# Patient Record
Sex: Male | Born: 1960 | Race: Black or African American | Hispanic: No | Marital: Single
Health system: Southern US, Community
[De-identification: ages and names within clinical notes are randomized; demographics above are authoritative.]

---

## 2004-06-26 ENCOUNTER — Inpatient Hospital Stay: Payer: Self-pay | Admitting: Internal Medicine

## 2004-06-26 ENCOUNTER — Other Ambulatory Visit: Payer: Self-pay

## 2004-08-27 ENCOUNTER — Emergency Department: Payer: Self-pay | Admitting: Emergency Medicine

## 2004-11-05 ENCOUNTER — Other Ambulatory Visit: Payer: Self-pay

## 2004-11-06 ENCOUNTER — Inpatient Hospital Stay: Payer: Self-pay | Admitting: Internal Medicine

## 2005-10-27 ENCOUNTER — Emergency Department: Payer: Self-pay | Admitting: Emergency Medicine

## 2005-10-27 ENCOUNTER — Other Ambulatory Visit: Payer: Self-pay

## 2006-02-27 ENCOUNTER — Inpatient Hospital Stay: Payer: Self-pay | Admitting: Internal Medicine

## 2006-09-16 ENCOUNTER — Ambulatory Visit: Payer: Self-pay | Admitting: Family

## 2006-12-09 ENCOUNTER — Ambulatory Visit: Payer: Self-pay | Admitting: Family

## 2007-03-03 ENCOUNTER — Ambulatory Visit: Payer: Self-pay | Admitting: Family

## 2007-06-02 ENCOUNTER — Ambulatory Visit: Payer: Self-pay | Admitting: Family

## 2007-06-12 ENCOUNTER — Emergency Department: Payer: Self-pay | Admitting: Emergency Medicine

## 2007-06-17 ENCOUNTER — Emergency Department: Payer: Self-pay | Admitting: Emergency Medicine

## 2007-09-01 ENCOUNTER — Other Ambulatory Visit: Payer: Self-pay

## 2007-09-01 ENCOUNTER — Inpatient Hospital Stay: Payer: Self-pay | Admitting: Internal Medicine

## 2007-10-13 ENCOUNTER — Ambulatory Visit: Payer: Self-pay | Admitting: Family

## 2007-12-22 ENCOUNTER — Ambulatory Visit: Payer: Self-pay | Admitting: Family

## 2008-01-20 ENCOUNTER — Inpatient Hospital Stay: Payer: Self-pay | Admitting: Internal Medicine

## 2008-04-12 ENCOUNTER — Ambulatory Visit: Payer: Self-pay | Admitting: Family

## 2008-04-30 ENCOUNTER — Inpatient Hospital Stay: Payer: Self-pay | Admitting: Internal Medicine

## 2008-05-13 ENCOUNTER — Inpatient Hospital Stay: Payer: Self-pay | Admitting: Internal Medicine

## 2008-07-19 ENCOUNTER — Ambulatory Visit: Payer: Self-pay | Admitting: Family

## 2008-09-15 ENCOUNTER — Emergency Department: Payer: Self-pay | Admitting: Emergency Medicine

## 2008-11-08 ENCOUNTER — Ambulatory Visit: Payer: Self-pay | Admitting: Family

## 2009-02-14 ENCOUNTER — Ambulatory Visit: Payer: Self-pay | Admitting: Family

## 2009-05-09 ENCOUNTER — Ambulatory Visit: Payer: Self-pay | Admitting: Family

## 2009-07-26 ENCOUNTER — Ambulatory Visit: Payer: Self-pay | Admitting: Family Medicine

## 2009-08-08 ENCOUNTER — Ambulatory Visit: Payer: Self-pay | Admitting: Family

## 2009-10-24 ENCOUNTER — Ambulatory Visit: Payer: Self-pay | Admitting: Family

## 2010-01-23 ENCOUNTER — Ambulatory Visit: Payer: Self-pay | Admitting: Family

## 2010-05-01 ENCOUNTER — Ambulatory Visit: Payer: Self-pay | Admitting: Family

## 2010-10-02 ENCOUNTER — Inpatient Hospital Stay: Payer: Self-pay | Admitting: Internal Medicine

## 2010-10-24 ENCOUNTER — Inpatient Hospital Stay: Payer: Self-pay | Admitting: Internal Medicine

## 2010-12-22 ENCOUNTER — Inpatient Hospital Stay: Payer: Self-pay | Admitting: Internal Medicine

## 2011-04-07 ENCOUNTER — Inpatient Hospital Stay: Payer: Self-pay | Admitting: Internal Medicine

## 2011-04-07 LAB — CK TOTAL AND CKMB (NOT AT ARMC)
CK, Total: 128 U/L (ref 35–232)
CK-MB: 7 ng/mL — ABNORMAL HIGH (ref 0.5–3.6)
CK-MB: 4.3 ng/mL — ABNORMAL HIGH (ref 0.5–3.6)

## 2011-04-07 LAB — CBC
MCH: 26.1 pg (ref 26.0–34.0)
MCHC: 32 g/dL (ref 32.0–36.0)
Platelet: 220 10*3/uL (ref 150–440)
RBC: 5.33 10*6/uL (ref 4.40–5.90)

## 2011-04-07 LAB — COMPREHENSIVE METABOLIC PANEL
Albumin: 3.4 g/dL (ref 3.4–5.0)
BUN: 22 mg/dL — ABNORMAL HIGH (ref 7–18)
Bilirubin,Total: 0.9 mg/dL (ref 0.2–1.0)
Chloride: 108 mmol/L — ABNORMAL HIGH (ref 98–107)
Co2: 25 mmol/L (ref 21–32)
Creatinine: 1.94 mg/dL — ABNORMAL HIGH (ref 0.60–1.30)
Glucose: 85 mg/dL (ref 65–99)
Potassium: 4.3 mmol/L (ref 3.5–5.1)
SGOT(AST): 32 U/L (ref 15–37)
SGPT (ALT): 28 U/L
Total Protein: 7.7 g/dL (ref 6.4–8.2)

## 2011-04-07 LAB — TROPONIN I
Troponin-I: 0.06 ng/mL — ABNORMAL HIGH
Troponin-I: 0.06 ng/mL — ABNORMAL HIGH

## 2011-04-07 LAB — PRO B NATRIURETIC PEPTIDE: B-Type Natriuretic Peptide: 18639 pg/mL — ABNORMAL HIGH (ref 0–125)

## 2011-04-08 LAB — PROTIME-INR
INR: 1.4
Prothrombin Time: 17.2 secs — ABNORMAL HIGH (ref 11.5–14.7)

## 2011-04-08 LAB — COMPREHENSIVE METABOLIC PANEL
Albumin: 2.6 g/dL — ABNORMAL LOW (ref 3.4–5.0)
Alkaline Phosphatase: 51 U/L (ref 50–136)
Anion Gap: 12 (ref 7–16)
Calcium, Total: 8.5 mg/dL (ref 8.5–10.1)
Chloride: 105 mmol/L (ref 98–107)
Creatinine: 1.76 mg/dL — ABNORMAL HIGH (ref 0.60–1.30)
EGFR (African American): 53 — ABNORMAL LOW
EGFR (Non-African Amer.): 43 — ABNORMAL LOW
Glucose: 90 mg/dL (ref 65–99)
Osmolality: 287 (ref 275–301)
Potassium: 3.8 mmol/L (ref 3.5–5.1)
SGOT(AST): 20 U/L (ref 15–37)
SGPT (ALT): 20 U/L
Sodium: 143 mmol/L (ref 136–145)
Total Protein: 6.3 g/dL — ABNORMAL LOW (ref 6.4–8.2)

## 2011-04-08 LAB — CBC WITH DIFFERENTIAL/PLATELET
Basophil #: 0 10*3/uL (ref 0.0–0.1)
Basophil %: 0.8 %
HCT: 38.7 % — ABNORMAL LOW (ref 40.0–52.0)
HGB: 12.4 g/dL — ABNORMAL LOW (ref 13.0–18.0)
Lymphocyte %: 16.9 %
MCV: 82 fL (ref 80–100)
Monocyte %: 12.6 %
Neutrophil #: 4.1 10*3/uL (ref 1.4–6.5)
Neutrophil %: 68 %
RBC: 4.69 10*6/uL (ref 4.40–5.90)
RDW: 17.2 % — ABNORMAL HIGH (ref 11.5–14.5)
WBC: 6.1 10*3/uL (ref 3.8–10.6)

## 2011-04-08 LAB — TROPONIN I: Troponin-I: 0.06 ng/mL — ABNORMAL HIGH

## 2011-04-09 LAB — BASIC METABOLIC PANEL
BUN: 27 mg/dL — ABNORMAL HIGH (ref 7–18)
Chloride: 101 mmol/L (ref 98–107)
Creatinine: 1.84 mg/dL — ABNORMAL HIGH (ref 0.60–1.30)
EGFR (African American): 50 — ABNORMAL LOW
EGFR (Non-African Amer.): 41 — ABNORMAL LOW
Glucose: 88 mg/dL (ref 65–99)
Osmolality: 288 (ref 275–301)
Potassium: 3.3 mmol/L — ABNORMAL LOW (ref 3.5–5.1)
Sodium: 142 mmol/L (ref 136–145)

## 2011-09-10 ENCOUNTER — Inpatient Hospital Stay: Payer: Self-pay | Admitting: Internal Medicine

## 2011-09-10 LAB — BASIC METABOLIC PANEL
Chloride: 108 mmol/L — ABNORMAL HIGH (ref 98–107)
Co2: 26 mmol/L (ref 21–32)
Creatinine: 1.86 mg/dL — ABNORMAL HIGH (ref 0.60–1.30)
EGFR (African American): 47 — ABNORMAL LOW
EGFR (Non-African Amer.): 41 — ABNORMAL LOW
Osmolality: 286 (ref 275–301)
Potassium: 3.6 mmol/L (ref 3.5–5.1)
Sodium: 141 mmol/L (ref 136–145)

## 2011-09-10 LAB — CBC WITH DIFFERENTIAL/PLATELET
Basophil %: 0.8 %
Eosinophil #: 0.2 10*3/uL (ref 0.0–0.7)
Eosinophil %: 2.6 %
HGB: 12.4 g/dL — ABNORMAL LOW (ref 13.0–18.0)
Lymphocyte #: 1.2 10*3/uL (ref 1.0–3.6)
Lymphocyte %: 19.8 %
MCV: 78 fL — ABNORMAL LOW (ref 80–100)
Monocyte %: 11.1 %
Neutrophil #: 3.9 10*3/uL (ref 1.4–6.5)
Neutrophil %: 65.7 %
RBC: 4.82 10*6/uL (ref 4.40–5.90)
WBC: 5.9 10*3/uL (ref 3.8–10.6)

## 2011-09-10 LAB — URINALYSIS, COMPLETE
Bacteria: NONE SEEN
Bilirubin,UR: NEGATIVE
Ketone: NEGATIVE
Leukocyte Esterase: NEGATIVE
Ph: 5 (ref 4.5–8.0)
Protein: 100
RBC,UR: 2 /HPF (ref 0–5)

## 2011-09-10 LAB — CK TOTAL AND CKMB (NOT AT ARMC)
CK, Total: 123 U/L (ref 35–232)
CK-MB: 3.1 ng/mL (ref 0.5–3.6)

## 2011-09-10 LAB — APTT: Activated PTT: 32.2 secs (ref 23.6–35.9)

## 2011-09-10 LAB — PRO B NATRIURETIC PEPTIDE: B-Type Natriuretic Peptide: 15986 pg/mL — ABNORMAL HIGH (ref 0–125)

## 2011-09-11 LAB — CK TOTAL AND CKMB (NOT AT ARMC)
CK, Total: 88 U/L (ref 35–232)
CK-MB: 1.8 ng/mL (ref 0.5–3.6)

## 2011-09-11 LAB — CBC WITH DIFFERENTIAL/PLATELET
Basophil #: 0.1 10*3/uL (ref 0.0–0.1)
Eosinophil #: 0.1 10*3/uL (ref 0.0–0.7)
HCT: 38.7 % — ABNORMAL LOW (ref 40.0–52.0)
HGB: 12.8 g/dL — ABNORMAL LOW (ref 13.0–18.0)
Lymphocyte #: 1.2 10*3/uL (ref 1.0–3.6)
Lymphocyte %: 20.5 %
MCH: 25.8 pg — ABNORMAL LOW (ref 26.0–34.0)
MCHC: 33 g/dL (ref 32.0–36.0)
Neutrophil %: 61.7 %
RBC: 4.94 10*6/uL (ref 4.40–5.90)
RDW: 18.6 % — ABNORMAL HIGH (ref 11.5–14.5)
WBC: 5.9 10*3/uL (ref 3.8–10.6)

## 2011-09-11 LAB — BASIC METABOLIC PANEL
BUN: 24 mg/dL — ABNORMAL HIGH (ref 7–18)
Calcium, Total: 8.4 mg/dL — ABNORMAL LOW (ref 8.5–10.1)
Chloride: 108 mmol/L — ABNORMAL HIGH (ref 98–107)
EGFR (Non-African Amer.): 37 — ABNORMAL LOW
Osmolality: 291 (ref 275–301)
Potassium: 3.6 mmol/L (ref 3.5–5.1)

## 2011-09-11 LAB — TROPONIN I
Troponin-I: 0.03 ng/mL
Troponin-I: 0.03 ng/mL

## 2011-09-11 LAB — RAPID HIV-1/2 QL/CONFIRM: HIV-1/2,Rapid Ql: NEGATIVE

## 2011-09-11 LAB — PHOSPHORUS: Phosphorus: 3.2 mg/dL (ref 2.5–4.9)

## 2011-09-12 ENCOUNTER — Emergency Department: Payer: Self-pay | Admitting: Emergency Medicine

## 2011-09-12 LAB — BASIC METABOLIC PANEL
BUN: 23 mg/dL — ABNORMAL HIGH (ref 7–18)
Calcium, Total: 8.4 mg/dL — ABNORMAL LOW (ref 8.5–10.1)
Co2: 27 mmol/L (ref 21–32)
EGFR (African American): 52 — ABNORMAL LOW
EGFR (Non-African Amer.): 45 — ABNORMAL LOW
Glucose: 82 mg/dL (ref 65–99)
Potassium: 3.8 mmol/L (ref 3.5–5.1)
Sodium: 138 mmol/L (ref 136–145)

## 2011-09-12 LAB — CBC WITH DIFFERENTIAL/PLATELET
Basophil %: 0.6 %
Eosinophil #: 0.1 10*3/uL (ref 0.0–0.7)
Eosinophil %: 1.8 %
HGB: 12.8 g/dL — ABNORMAL LOW (ref 13.0–18.0)
Lymphocyte #: 1.1 10*3/uL (ref 1.0–3.6)
Lymphocyte %: 16.5 %
MCV: 79 fL — ABNORMAL LOW (ref 80–100)
Monocyte %: 12.3 %
Neutrophil #: 4.8 10*3/uL (ref 1.4–6.5)
Neutrophil %: 68.8 %
Platelet: 172 10*3/uL (ref 150–440)
RBC: 4.96 10*6/uL (ref 4.40–5.90)
WBC: 7 10*3/uL (ref 3.8–10.6)

## 2011-10-29 ENCOUNTER — Inpatient Hospital Stay: Payer: Self-pay | Admitting: Internal Medicine

## 2011-10-29 LAB — BASIC METABOLIC PANEL
Anion Gap: 9 (ref 7–16)
Chloride: 107 mmol/L (ref 98–107)
Co2: 26 mmol/L (ref 21–32)
Creatinine: 2.07 mg/dL — ABNORMAL HIGH (ref 0.60–1.30)
EGFR (Non-African Amer.): 36 — ABNORMAL LOW
Potassium: 4.8 mmol/L (ref 3.5–5.1)
Sodium: 142 mmol/L (ref 136–145)

## 2011-10-29 LAB — PRO B NATRIURETIC PEPTIDE: B-Type Natriuretic Peptide: 25254 pg/mL — ABNORMAL HIGH (ref 0–125)

## 2011-10-29 LAB — TROPONIN I
Troponin-I: 0.04 ng/mL
Troponin-I: 0.05 ng/mL

## 2011-10-29 LAB — CBC
MCH: 25.2 pg — ABNORMAL LOW (ref 26.0–34.0)
MCV: 77 fL — ABNORMAL LOW (ref 80–100)

## 2011-10-30 LAB — CBC WITH DIFFERENTIAL/PLATELET
Basophil %: 0.7 %
Eosinophil #: 0.1 10*3/uL (ref 0.0–0.7)
HCT: 36 % — ABNORMAL LOW (ref 40.0–52.0)
HGB: 11.7 g/dL — ABNORMAL LOW (ref 13.0–18.0)
Lymphocyte %: 17.6 %
MCHC: 32.5 g/dL (ref 32.0–36.0)
Neutrophil %: 67.6 %
Platelet: 168 10*3/uL (ref 150–440)
RBC: 4.66 10*6/uL (ref 4.40–5.90)

## 2011-10-30 LAB — BASIC METABOLIC PANEL
Anion Gap: 10 (ref 7–16)
BUN: 23 mg/dL — ABNORMAL HIGH (ref 7–18)
Calcium, Total: 8.3 mg/dL — ABNORMAL LOW (ref 8.5–10.1)
Chloride: 108 mmol/L — ABNORMAL HIGH (ref 98–107)
EGFR (Non-African Amer.): 34 — ABNORMAL LOW
Glucose: 133 mg/dL — ABNORMAL HIGH (ref 65–99)
Osmolality: 291 (ref 275–301)
Sodium: 143 mmol/L (ref 136–145)

## 2011-10-30 LAB — TROPONIN I: Troponin-I: 0.05 ng/mL

## 2011-10-30 LAB — POTASSIUM: Potassium: 3.9 mmol/L (ref 3.5–5.1)

## 2011-10-30 LAB — LIPID PANEL
Triglycerides: 64 mg/dL (ref 0–200)
VLDL Cholesterol, Calc: 13 mg/dL (ref 5–40)

## 2011-11-25 ENCOUNTER — Inpatient Hospital Stay: Payer: Self-pay | Admitting: Specialist

## 2011-11-25 LAB — CK TOTAL AND CKMB (NOT AT ARMC)
CK, Total: 169 U/L (ref 35–232)
CK, Total: 124 U/L
CK-MB: 3.5 ng/mL

## 2011-11-25 LAB — COMPREHENSIVE METABOLIC PANEL
Albumin: 2.9 g/dL — ABNORMAL LOW (ref 3.4–5.0)
Alkaline Phosphatase: 64 U/L (ref 50–136)
Calcium, Total: 9 mg/dL (ref 8.5–10.1)
Chloride: 107 mmol/L (ref 98–107)
Co2: 28 mmol/L (ref 21–32)
Creatinine: 2.5 mg/dL — ABNORMAL HIGH (ref 0.60–1.30)
EGFR (African American): 33 — ABNORMAL LOW
EGFR (Non-African Amer.): 28 — ABNORMAL LOW
Glucose: 112 mg/dL — ABNORMAL HIGH (ref 65–99)
SGOT(AST): 22 U/L (ref 15–37)
SGPT (ALT): 13 U/L (ref 12–78)

## 2011-11-25 LAB — CBC
HCT: 41.1 %
HGB: 13.6 g/dL
MCH: 25 pg — ABNORMAL LOW
MCHC: 33 g/dL
MCV: 76 fL — ABNORMAL LOW
Platelet: 190 x10 3/mm 3
RBC: 5.43 x10 6/mm 3
RDW: 19.3 % — ABNORMAL HIGH
WBC: 7.7 x10 3/mm 3

## 2011-11-25 LAB — TROPONIN I
Troponin-I: 0.06 ng/mL — ABNORMAL HIGH
Troponin-I: 0.05 ng/mL

## 2011-11-25 LAB — PRO B NATRIURETIC PEPTIDE: B-Type Natriuretic Peptide: 32993 pg/mL — ABNORMAL HIGH

## 2011-11-26 LAB — CBC WITH DIFFERENTIAL/PLATELET
Basophil #: 0 10*3/uL (ref 0.0–0.1)
Eosinophil #: 0 10*3/uL (ref 0.0–0.7)
Lymphocyte #: 0.5 10*3/uL — ABNORMAL LOW (ref 1.0–3.6)
MCH: 24.9 pg — ABNORMAL LOW (ref 26.0–34.0)
MCHC: 32.5 g/dL (ref 32.0–36.0)
MCV: 77 fL — ABNORMAL LOW (ref 80–100)
Monocyte #: 0.1 x10 3/mm — ABNORMAL LOW (ref 0.2–1.0)
Monocyte %: 1.6 %
Neutrophil #: 4.9 10*3/uL (ref 1.4–6.5)
Neutrophil %: 89.6 %
Platelet: 195 10*3/uL (ref 150–440)
RDW: 19.3 % — ABNORMAL HIGH (ref 11.5–14.5)
WBC: 5.5 10*3/uL (ref 3.8–10.6)

## 2011-11-26 LAB — BASIC METABOLIC PANEL
Calcium, Total: 9 mg/dL (ref 8.5–10.1)
Co2: 26 mmol/L (ref 21–32)
Creatinine: 2.37 mg/dL — ABNORMAL HIGH (ref 0.60–1.30)
Glucose: 171 mg/dL — ABNORMAL HIGH (ref 65–99)
Osmolality: 291 (ref 275–301)
Sodium: 142 mmol/L (ref 136–145)

## 2011-11-26 LAB — PROTIME-INR
INR: 1.4
Prothrombin Time: 17.7 secs — ABNORMAL HIGH (ref 11.5–14.7)

## 2011-11-26 LAB — TROPONIN I: Troponin-I: 0.03 ng/mL

## 2011-11-26 LAB — APTT: Activated PTT: 33.2 secs (ref 23.6–35.9)

## 2011-11-26 LAB — LACTATE DEHYDROGENASE: LDH: 183 U/L (ref 85–241)

## 2011-11-26 LAB — CK TOTAL AND CKMB (NOT AT ARMC)
CK, Total: 96 U/L (ref 35–232)
CK-MB: 2.9 ng/mL (ref 0.5–3.6)

## 2011-11-27 LAB — BODY FLUID CELL COUNT WITH DIFFERENTIAL
Eosinophil: 0 %
Other Cells BF: 0 %
Other Mononuclear Cells: 52 %

## 2011-11-28 LAB — MAGNESIUM
Magnesium: 2 mg/dL
Magnesium: 1.9 mg/dL

## 2011-11-28 LAB — RENAL FUNCTION PANEL
Chloride: 102 mmol/L (ref 98–107)
Co2: 30 mmol/L (ref 21–32)
EGFR (African American): 40 — ABNORMAL LOW
Glucose: 133 mg/dL — ABNORMAL HIGH (ref 65–99)
Osmolality: 294 (ref 275–301)
Phosphorus: 2.9 mg/dL (ref 2.5–4.9)
Potassium: 3.9 mmol/L (ref 3.5–5.1)
Sodium: 141 mmol/L (ref 136–145)

## 2011-11-29 LAB — BASIC METABOLIC PANEL
Anion Gap: 6 — ABNORMAL LOW (ref 7–16)
BUN: 41 mg/dL — ABNORMAL HIGH (ref 7–18)
Chloride: 98 mmol/L (ref 98–107)
Co2: 32 mmol/L (ref 21–32)
Creatinine: 1.64 mg/dL — ABNORMAL HIGH (ref 0.60–1.30)
EGFR (African American): 55 — ABNORMAL LOW
EGFR (Non-African Amer.): 47 — ABNORMAL LOW
Glucose: 140 mg/dL — ABNORMAL HIGH (ref 65–99)
Sodium: 136 mmol/L (ref 136–145)

## 2011-11-30 LAB — CULTURE, BLOOD (SINGLE)

## 2011-12-09 ENCOUNTER — Inpatient Hospital Stay: Payer: Self-pay | Admitting: Specialist

## 2011-12-09 LAB — CBC
HCT: 42.7 % (ref 40.0–52.0)
HGB: 14.2 g/dL (ref 13.0–18.0)
MCV: 76 fL — ABNORMAL LOW (ref 80–100)
RBC: 5.61 10*6/uL (ref 4.40–5.90)
RDW: 19.7 % — ABNORMAL HIGH (ref 11.5–14.5)
WBC: 8.5 10*3/uL (ref 3.8–10.6)

## 2011-12-09 LAB — CBC WITH DIFFERENTIAL/PLATELET
Basophil #: 0.1 10*3/uL (ref 0.0–0.1)
Eosinophil #: 0.1 10*3/uL (ref 0.0–0.7)
HCT: 40 % (ref 40.0–52.0)
Lymphocyte #: 1.2 10*3/uL (ref 1.0–3.6)
MCH: 23.9 pg — ABNORMAL LOW (ref 26.0–34.0)
MCHC: 31.6 g/dL — ABNORMAL LOW (ref 32.0–36.0)
MCV: 76 fL — ABNORMAL LOW (ref 80–100)
Monocyte #: 0.8 x10 3/mm (ref 0.2–1.0)
Monocyte %: 7.9 %
Neutrophil #: 7.8 10*3/uL — ABNORMAL HIGH (ref 1.4–6.5)
Neutrophil %: 78.6 %
Platelet: 135 10*3/uL — ABNORMAL LOW (ref 150–440)
RBC: 5.27 10*6/uL (ref 4.40–5.90)
RDW: 20.2 % — ABNORMAL HIGH (ref 11.5–14.5)
WBC: 9.9 10*3/uL (ref 3.8–10.6)

## 2011-12-09 LAB — BASIC METABOLIC PANEL
Anion Gap: 6 — ABNORMAL LOW (ref 7–16)
Chloride: 106 mmol/L (ref 98–107)
Co2: 29 mmol/L (ref 21–32)
Creatinine: 2 mg/dL — ABNORMAL HIGH (ref 0.60–1.30)
Osmolality: 286 (ref 275–301)
Potassium: 3.8 mmol/L (ref 3.5–5.1)

## 2011-12-09 LAB — PRO B NATRIURETIC PEPTIDE: B-Type Natriuretic Peptide: 42844 pg/mL — ABNORMAL HIGH (ref 0–125)

## 2011-12-09 LAB — TROPONIN I: Troponin-I: 0.04 ng/mL

## 2011-12-09 LAB — CK TOTAL AND CKMB (NOT AT ARMC): CK-MB: 3 ng/mL (ref 0.5–3.6)

## 2011-12-09 LAB — APTT: Activated PTT: 32 secs (ref 23.6–35.9)

## 2011-12-09 LAB — MAGNESIUM: Magnesium: 2.2 mg/dL

## 2011-12-10 LAB — BASIC METABOLIC PANEL
Anion Gap: 6 — ABNORMAL LOW (ref 7–16)
BUN: 23 mg/dL — ABNORMAL HIGH (ref 7–18)
Chloride: 107 mmol/L (ref 98–107)
Co2: 29 mmol/L (ref 21–32)
Creatinine: 1.92 mg/dL — ABNORMAL HIGH (ref 0.60–1.30)
Glucose: 161 mg/dL — ABNORMAL HIGH (ref 65–99)
Osmolality: 290 (ref 275–301)
Potassium: 3.7 mmol/L (ref 3.5–5.1)
Sodium: 142 mmol/L (ref 136–145)

## 2011-12-10 LAB — CK TOTAL AND CKMB (NOT AT ARMC): CK, Total: 70 U/L (ref 35–232)

## 2011-12-10 LAB — MAGNESIUM: Magnesium: 1.9 mg/dL

## 2011-12-10 LAB — CBC WITH DIFFERENTIAL/PLATELET
Basophil %: 0.5 %
Eosinophil #: 0.1 10*3/uL (ref 0.0–0.7)
Eosinophil %: 0.7 %
HCT: 37.7 % — ABNORMAL LOW (ref 40.0–52.0)
HGB: 12.3 g/dL — ABNORMAL LOW (ref 13.0–18.0)
Lymphocyte #: 0.9 10*3/uL — ABNORMAL LOW (ref 1.0–3.6)
MCH: 24.7 pg — ABNORMAL LOW (ref 26.0–34.0)
MCHC: 32.5 g/dL (ref 32.0–36.0)
MCV: 76 fL — ABNORMAL LOW (ref 80–100)
Monocyte #: 0.7 x10 3/mm (ref 0.2–1.0)
Neutrophil #: 6.2 10*3/uL (ref 1.4–6.5)
Neutrophil %: 78 %
RBC: 4.96 10*6/uL (ref 4.40–5.90)

## 2011-12-10 LAB — APTT
Activated PTT: 117.3 secs — ABNORMAL HIGH (ref 23.6–35.9)
Activated PTT: 69.5 secs — ABNORMAL HIGH (ref 23.6–35.9)

## 2011-12-10 LAB — HEMOGLOBIN A1C: Hemoglobin A1C: 6.2 % (ref 4.2–6.3)

## 2011-12-10 LAB — TROPONIN I
Troponin-I: 0.04 ng/mL
Troponin-I: 0.04 ng/mL

## 2011-12-11 LAB — BASIC METABOLIC PANEL
Calcium, Total: 8.3 mg/dL — ABNORMAL LOW (ref 8.5–10.1)
Chloride: 106 mmol/L (ref 98–107)
Co2: 30 mmol/L (ref 21–32)
EGFR (Non-African Amer.): 44 — ABNORMAL LOW
Glucose: 91 mg/dL (ref 65–99)
Osmolality: 289 (ref 275–301)
Potassium: 3.3 mmol/L — ABNORMAL LOW (ref 3.5–5.1)

## 2011-12-12 LAB — BASIC METABOLIC PANEL
Calcium, Total: 8.7 mg/dL (ref 8.5–10.1)
EGFR (Non-African Amer.): 40 — ABNORMAL LOW
Glucose: 82 mg/dL (ref 65–99)
Potassium: 3.3 mmol/L — ABNORMAL LOW (ref 3.5–5.1)
Sodium: 141 mmol/L (ref 136–145)

## 2011-12-12 LAB — PLATELET COUNT: Platelet: 135 10*3/uL — ABNORMAL LOW (ref 150–440)

## 2011-12-13 LAB — BASIC METABOLIC PANEL
Calcium, Total: 8.8 mg/dL (ref 8.5–10.1)
Co2: 33 mmol/L — ABNORMAL HIGH (ref 21–32)
Potassium: 3.4 mmol/L — ABNORMAL LOW (ref 3.5–5.1)
Sodium: 138 mmol/L (ref 136–145)

## 2011-12-14 LAB — BASIC METABOLIC PANEL
Anion Gap: 4 — ABNORMAL LOW (ref 7–16)
Calcium, Total: 8.4 mg/dL — ABNORMAL LOW (ref 8.5–10.1)
Co2: 33 mmol/L — ABNORMAL HIGH (ref 21–32)
EGFR (African American): 50 — ABNORMAL LOW
EGFR (Non-African Amer.): 43 — ABNORMAL LOW
Glucose: 92 mg/dL (ref 65–99)
Osmolality: 286 (ref 275–301)

## 2011-12-15 LAB — CULTURE, BLOOD (SINGLE)

## 2011-12-20 ENCOUNTER — Ambulatory Visit: Payer: Self-pay | Admitting: Internal Medicine

## 2012-01-06 ENCOUNTER — Inpatient Hospital Stay: Payer: Self-pay | Admitting: Internal Medicine

## 2012-01-06 LAB — CBC WITH DIFFERENTIAL/PLATELET
Basophil #: 0 10*3/uL (ref 0.0–0.1)
Eosinophil #: 0 10*3/uL (ref 0.0–0.7)
HCT: 38 % — ABNORMAL LOW (ref 40.0–52.0)
HGB: 12.6 g/dL — ABNORMAL LOW (ref 13.0–18.0)
Lymphocyte #: 1.1 10*3/uL (ref 1.0–3.6)
Lymphocyte %: 11.4 %
MCHC: 33.1 g/dL (ref 32.0–36.0)
MCV: 76 fL — ABNORMAL LOW (ref 80–100)
Monocyte %: 9.3 %
Neutrophil #: 7.6 10*3/uL — ABNORMAL HIGH (ref 1.4–6.5)
Platelet: 218 10*3/uL (ref 150–440)
RDW: 21.8 % — ABNORMAL HIGH (ref 11.5–14.5)
WBC: 9.7 10*3/uL (ref 3.8–10.6)

## 2012-01-06 LAB — URINALYSIS, COMPLETE
Bilirubin,UR: NEGATIVE
Blood: NEGATIVE
Glucose,UR: NEGATIVE mg/dL (ref 0–75)
Leukocyte Esterase: NEGATIVE
Nitrite: NEGATIVE
Ph: 7 (ref 4.5–8.0)
RBC,UR: <1 /HPF (ref 0–5)
Squamous Epithelial: NONE SEEN
WBC UR: <1 /HPF (ref 0–5)

## 2012-01-06 LAB — COMPREHENSIVE METABOLIC PANEL
Albumin: 3.1 g/dL — ABNORMAL LOW (ref 3.4–5.0)
Anion Gap: 9 (ref 7–16)
BUN: 23 mg/dL — ABNORMAL HIGH (ref 7–18)
Bilirubin,Total: 0.9 mg/dL (ref 0.2–1.0)
Chloride: 110 mmol/L — ABNORMAL HIGH (ref 98–107)
Co2: 24 mmol/L (ref 21–32)
Creatinine: 1.61 mg/dL — ABNORMAL HIGH (ref 0.60–1.30)
EGFR (African American): 56 — ABNORMAL LOW
EGFR (Non-African Amer.): 48 — ABNORMAL LOW
Osmolality: 289 (ref 275–301)
Potassium: 4.2 mmol/L (ref 3.5–5.1)
SGOT(AST): 13 U/L — ABNORMAL LOW (ref 15–37)
SGPT (ALT): 11 U/L — ABNORMAL LOW (ref 12–78)
Sodium: 143 mmol/L (ref 136–145)

## 2012-01-06 LAB — TROPONIN I
Troponin-I: 0.03 ng/mL
Troponin-I: 0.04 ng/mL

## 2012-01-06 LAB — CK TOTAL AND CKMB (NOT AT ARMC)
CK, Total: 74 U/L (ref 35–232)
CK-MB: 1.9 ng/mL (ref 0.5–3.6)

## 2012-01-06 LAB — PRO B NATRIURETIC PEPTIDE: B-Type Natriuretic Peptide: 30752 pg/mL — ABNORMAL HIGH (ref 0–125)

## 2012-01-07 LAB — BASIC METABOLIC PANEL
Anion Gap: 6 — ABNORMAL LOW (ref 7–16)
BUN: 31 mg/dL — ABNORMAL HIGH (ref 7–18)
Chloride: 107 mmol/L (ref 98–107)
Co2: 28 mmol/L (ref 21–32)
Creatinine: 1.64 mg/dL — ABNORMAL HIGH (ref 0.60–1.30)
EGFR (Non-African Amer.): 47 — ABNORMAL LOW
Potassium: 4.1 mmol/L (ref 3.5–5.1)

## 2012-01-08 LAB — BASIC METABOLIC PANEL
BUN: 38 mg/dL — ABNORMAL HIGH (ref 7–18)
Calcium, Total: 9.1 mg/dL (ref 8.5–10.1)
Co2: 27 mmol/L (ref 21–32)
Creatinine: 1.72 mg/dL — ABNORMAL HIGH (ref 0.60–1.30)
EGFR (African American): 52 — ABNORMAL LOW
Glucose: 207 mg/dL — ABNORMAL HIGH (ref 65–99)
Osmolality: 291 (ref 275–301)
Potassium: 4.3 mmol/L (ref 3.5–5.1)
Sodium: 138 mmol/L (ref 136–145)

## 2012-01-09 LAB — BASIC METABOLIC PANEL
Anion Gap: 7 (ref 7–16)
BUN: 48 mg/dL — ABNORMAL HIGH (ref 7–18)
Chloride: 101 mmol/L (ref 98–107)
Co2: 30 mmol/L (ref 21–32)
Creatinine: 1.59 mg/dL — ABNORMAL HIGH (ref 0.60–1.30)
Osmolality: 291 (ref 275–301)
Potassium: 3.9 mmol/L (ref 3.5–5.1)
Sodium: 138 mmol/L (ref 136–145)

## 2012-01-20 ENCOUNTER — Ambulatory Visit: Payer: Self-pay | Admitting: Internal Medicine

## 2012-08-09 ENCOUNTER — Inpatient Hospital Stay: Payer: Self-pay | Admitting: Internal Medicine

## 2012-08-09 LAB — COMPREHENSIVE METABOLIC PANEL
Albumin: 3.6 g/dL (ref 3.4–5.0)
Alkaline Phosphatase: 97 U/L (ref 50–136)
Anion Gap: 6 — ABNORMAL LOW (ref 7–16)
Co2: 25 mmol/L (ref 21–32)
EGFR (Non-African Amer.): 46 — ABNORMAL LOW
Glucose: 144 mg/dL — ABNORMAL HIGH (ref 65–99)
Osmolality: 286 (ref 275–301)
Potassium: 4.5 mmol/L (ref 3.5–5.1)
Sodium: 139 mmol/L (ref 136–145)

## 2012-08-09 LAB — CBC
MCH: 24.3 pg — ABNORMAL LOW (ref 26.0–34.0)
MCV: 77 fL — ABNORMAL LOW (ref 80–100)
RDW: 19.6 % — ABNORMAL HIGH (ref 11.5–14.5)
WBC: 11.3 10*3/uL — ABNORMAL HIGH (ref 3.8–10.6)

## 2012-08-09 LAB — TROPONIN I: Troponin-I: 0.02 ng/mL

## 2012-08-09 LAB — CK TOTAL AND CKMB (NOT AT ARMC)
CK, Total: 271 U/L — ABNORMAL HIGH (ref 35–232)
CK-MB: 4.1 ng/mL — ABNORMAL HIGH (ref 0.5–3.6)

## 2012-08-09 LAB — PRO B NATRIURETIC PEPTIDE: B-Type Natriuretic Peptide: 6663 pg/mL — ABNORMAL HIGH (ref 0–125)

## 2012-08-10 LAB — CBC WITH DIFFERENTIAL/PLATELET
Basophil #: 0 10*3/uL (ref 0.0–0.1)
Basophil %: 0.3 %
Eosinophil %: 0 %
HCT: 35.9 % — ABNORMAL LOW (ref 40.0–52.0)
Lymphocyte #: 0.3 10*3/uL — ABNORMAL LOW (ref 1.0–3.6)
Lymphocyte %: 5.8 %
MCHC: 32.7 g/dL (ref 32.0–36.0)
MCV: 76 fL — ABNORMAL LOW (ref 80–100)
Monocyte #: 0.1 x10 3/mm — ABNORMAL LOW (ref 0.2–1.0)
Monocyte %: 2.3 %
Neutrophil %: 91.6 %
Platelet: 224 10*3/uL (ref 150–440)
RDW: 18.9 % — ABNORMAL HIGH (ref 11.5–14.5)

## 2012-08-10 LAB — BASIC METABOLIC PANEL
Anion Gap: 6 — ABNORMAL LOW (ref 7–16)
Co2: 25 mmol/L (ref 21–32)
Creatinine: 1.73 mg/dL — ABNORMAL HIGH (ref 0.60–1.30)
EGFR (Non-African Amer.): 44 — ABNORMAL LOW
Osmolality: 287 (ref 275–301)

## 2012-08-10 LAB — LIPID PANEL
Cholesterol: 154 mg/dL (ref 0–200)
HDL Cholesterol: 55 mg/dL (ref 40–60)
Ldl Cholesterol, Calc: 90 mg/dL (ref 0–100)
Triglycerides: 46 mg/dL (ref 0–200)
VLDL Cholesterol, Calc: 9 mg/dL (ref 5–40)

## 2012-08-10 LAB — CK TOTAL AND CKMB (NOT AT ARMC)
CK, Total: 146 U/L (ref 35–232)
CK, Total: 115 U/L (ref 35–232)
CK-MB: 2.8 ng/mL (ref 0.5–3.6)
CK-MB: 3 ng/mL (ref 0.5–3.6)

## 2012-08-11 LAB — BASIC METABOLIC PANEL
Anion Gap: 5 — ABNORMAL LOW (ref 7–16)
BUN: 47 mg/dL — ABNORMAL HIGH (ref 7–18)
Calcium, Total: 9.2 mg/dL (ref 8.5–10.1)
Chloride: 102 mmol/L (ref 98–107)
Creatinine: 1.89 mg/dL — ABNORMAL HIGH (ref 0.60–1.30)
EGFR (African American): 46 — ABNORMAL LOW
Glucose: 156 mg/dL — ABNORMAL HIGH (ref 65–99)

## 2012-08-11 LAB — CBC WITH DIFFERENTIAL/PLATELET
Eosinophil %: 0 %
HGB: 12.2 g/dL — ABNORMAL LOW (ref 13.0–18.0)
MCH: 25 pg — ABNORMAL LOW (ref 26.0–34.0)
Neutrophil #: 10.8 10*3/uL — ABNORMAL HIGH (ref 1.4–6.5)
Platelet: 212 10*3/uL (ref 150–440)
RBC: 4.91 10*6/uL (ref 4.40–5.90)
WBC: 11.6 10*3/uL — ABNORMAL HIGH (ref 3.8–10.6)

## 2012-08-11 LAB — HEMOGLOBIN A1C: Hemoglobin A1C: 5.2 % (ref 4.2–6.3)

## 2012-08-12 LAB — CBC WITH DIFFERENTIAL/PLATELET
Basophil #: 0 10*3/uL (ref 0.0–0.1)
Basophil %: 0 %
Eosinophil #: 0 10*3/uL (ref 0.0–0.7)
Eosinophil %: 0 %
HCT: 34.6 % — ABNORMAL LOW (ref 40.0–52.0)
HGB: 11.5 g/dL — ABNORMAL LOW (ref 13.0–18.0)
Lymphocyte %: 3.3 %
MCH: 25 pg — ABNORMAL LOW (ref 26.0–34.0)
MCHC: 33.1 g/dL (ref 32.0–36.0)
Monocyte #: 0.3 x10 3/mm (ref 0.2–1.0)
Monocyte %: 2.7 %
Neutrophil #: 9.5 10*3/uL — ABNORMAL HIGH (ref 1.4–6.5)
Neutrophil %: 94 %
RBC: 4.58 10*6/uL (ref 4.40–5.90)
RDW: 19 % — ABNORMAL HIGH (ref 11.5–14.5)

## 2012-08-12 LAB — BASIC METABOLIC PANEL
Anion Gap: 7 (ref 7–16)
Calcium, Total: 8.3 mg/dL — ABNORMAL LOW (ref 8.5–10.1)
Chloride: 98 mmol/L (ref 98–107)
Co2: 26 mmol/L (ref 21–32)
Creatinine: 2.64 mg/dL — ABNORMAL HIGH (ref 0.60–1.30)
EGFR (African American): 31 — ABNORMAL LOW
EGFR (Non-African Amer.): 26 — ABNORMAL LOW

## 2012-08-12 LAB — T4, FREE: Free Thyroxine: 0.92 ng/dL (ref 0.76–1.46)

## 2012-08-13 LAB — BASIC METABOLIC PANEL
BUN: 60 mg/dL — ABNORMAL HIGH (ref 7–18)
Calcium, Total: 8.7 mg/dL (ref 8.5–10.1)
Chloride: 104 mmol/L (ref 98–107)
Co2: 29 mmol/L (ref 21–32)
EGFR (Non-African Amer.): 40 — ABNORMAL LOW
Glucose: 161 mg/dL — ABNORMAL HIGH (ref 65–99)
Potassium: 4.4 mmol/L (ref 3.5–5.1)
Sodium: 138 mmol/L (ref 136–145)

## 2012-08-14 LAB — BASIC METABOLIC PANEL
Anion Gap: 3 — ABNORMAL LOW (ref 7–16)
BUN: 53 mg/dL — ABNORMAL HIGH (ref 7–18)
Calcium, Total: 8.5 mg/dL (ref 8.5–10.1)
Co2: 29 mmol/L (ref 21–32)
Glucose: 206 mg/dL — ABNORMAL HIGH (ref 65–99)
Sodium: 137 mmol/L (ref 136–145)

## 2012-08-15 LAB — BASIC METABOLIC PANEL
Anion Gap: 5 — ABNORMAL LOW (ref 7–16)
BUN: 51 mg/dL — ABNORMAL HIGH (ref 7–18)
Chloride: 104 mmol/L (ref 98–107)
Co2: 27 mmol/L (ref 21–32)
EGFR (African American): >60
Glucose: 125 mg/dL — ABNORMAL HIGH (ref 65–99)
Sodium: 136 mmol/L (ref 136–145)

## 2012-08-16 LAB — CULTURE, BLOOD (SINGLE)

## 2012-08-16 LAB — CBC WITH DIFFERENTIAL/PLATELET
HCT: 33.8 % — ABNORMAL LOW (ref 40.0–52.0)
HGB: 11.2 g/dL — ABNORMAL LOW (ref 13.0–18.0)
Lymphocytes: 12 %
MCH: 24.9 pg — ABNORMAL LOW (ref 26.0–34.0)
MCHC: 33.2 g/dL (ref 32.0–36.0)
MCV: 75 fL — ABNORMAL LOW (ref 80–100)
Monocytes: 11 %
Platelet: 202 10*3/uL (ref 150–440)
Segmented Neutrophils: 77 %
WBC: 8.5 10*3/uL (ref 3.8–10.6)

## 2012-08-16 LAB — BASIC METABOLIC PANEL
Anion Gap: 4 — ABNORMAL LOW (ref 7–16)
BUN: 48 mg/dL — ABNORMAL HIGH (ref 7–18)
Calcium, Total: 8.5 mg/dL (ref 8.5–10.1)
Chloride: 104 mmol/L (ref 98–107)
EGFR (African American): >60
EGFR (Non-African Amer.): 54 — ABNORMAL LOW
Glucose: 91 mg/dL (ref 65–99)
Osmolality: 284 (ref 275–301)
Potassium: 4.4 mmol/L (ref 3.5–5.1)
Sodium: 136 mmol/L (ref 136–145)

## 2012-08-24 LAB — CULTURE, BLOOD (SINGLE)

## 2012-11-08 ENCOUNTER — Emergency Department: Payer: Self-pay | Admitting: Emergency Medicine

## 2012-11-08 LAB — BASIC METABOLIC PANEL
Anion Gap: 5 — ABNORMAL LOW (ref 7–16)
Chloride: 111 mmol/L — ABNORMAL HIGH (ref 98–107)
Creatinine: 1.71 mg/dL — ABNORMAL HIGH (ref 0.60–1.30)
EGFR (African American): 52 — ABNORMAL LOW
EGFR (Non-African Amer.): 45 — ABNORMAL LOW
Glucose: 94 mg/dL (ref 65–99)
Osmolality: 282 (ref 275–301)

## 2012-11-08 LAB — TROPONIN I
Troponin-I: 0.04 ng/mL
Troponin-I: 0.04 ng/mL

## 2012-11-08 LAB — CBC
HCT: 35.3 % — ABNORMAL LOW (ref 40.0–52.0)
HGB: 11.4 g/dL — ABNORMAL LOW (ref 13.0–18.0)
MCHC: 32.2 g/dL (ref 32.0–36.0)
Platelet: 199 10*3/uL (ref 150–440)
WBC: 7.3 10*3/uL (ref 3.8–10.6)

## 2012-11-08 LAB — PRO B NATRIURETIC PEPTIDE: B-Type Natriuretic Peptide: 9879 pg/mL — ABNORMAL HIGH (ref 0–125)

## 2013-01-01 ENCOUNTER — Inpatient Hospital Stay: Payer: Self-pay | Admitting: Internal Medicine

## 2013-01-01 LAB — CBC WITH DIFFERENTIAL/PLATELET
Basophil #: 0.1 10*3/uL (ref 0.0–0.1)
Basophil %: 0.8 %
Eosinophil #: 0 10*3/uL (ref 0.0–0.7)
Eosinophil %: 0.4 %
HGB: 11.2 g/dL — ABNORMAL LOW (ref 13.0–18.0)
Lymphocyte #: 0.9 10*3/uL — ABNORMAL LOW (ref 1.0–3.6)
Lymphocyte %: 10.7 %
MCH: 23 pg — ABNORMAL LOW (ref 26.0–34.0)
MCHC: 31.4 g/dL — ABNORMAL LOW (ref 32.0–36.0)
MCV: 74 fL — ABNORMAL LOW (ref 80–100)
Monocyte #: 0.4 x10 3/mm (ref 0.2–1.0)
Monocyte %: 5.3 %
RDW: 18.8 % — ABNORMAL HIGH (ref 11.5–14.5)

## 2013-01-01 LAB — URINALYSIS, COMPLETE
Bilirubin,UR: NEGATIVE
Blood: NEGATIVE
Glucose,UR: NEGATIVE mg/dL (ref 0–75)
Ketone: NEGATIVE
Leukocyte Esterase: NEGATIVE
Ph: 6 (ref 4.5–8.0)
Protein: 100
RBC,UR: <1 /HPF (ref 0–5)
Squamous Epithelial: NONE SEEN

## 2013-01-01 LAB — COMPREHENSIVE METABOLIC PANEL
Alkaline Phosphatase: 73 U/L
Bilirubin,Total: 0.5 mg/dL (ref 0.2–1.0)
Co2: 26 mmol/L (ref 21–32)
Creatinine: 1.86 mg/dL — ABNORMAL HIGH (ref 0.60–1.30)
EGFR (African American): 47 — ABNORMAL LOW
Osmolality: 277 (ref 275–301)
Potassium: 3.5 mmol/L (ref 3.5–5.1)
SGOT(AST): 17 U/L (ref 15–37)
SGPT (ALT): 19 U/L (ref 12–78)
Sodium: 137 mmol/L (ref 136–145)
Total Protein: 7.2 g/dL (ref 6.4–8.2)

## 2013-01-01 LAB — TROPONIN I: Troponin-I: 0.03 ng/mL

## 2013-01-01 LAB — PRO B NATRIURETIC PEPTIDE: B-Type Natriuretic Peptide: 14021 pg/mL — ABNORMAL HIGH (ref 0–125)

## 2013-01-02 LAB — COMPREHENSIVE METABOLIC PANEL
Albumin: 2.8 g/dL — ABNORMAL LOW (ref 3.4–5.0)
Alkaline Phosphatase: 71 U/L
BUN: 21 mg/dL — ABNORMAL HIGH (ref 7–18)
Calcium, Total: 8.9 mg/dL (ref 8.5–10.1)
EGFR (African American): 47 — ABNORMAL LOW
EGFR (Non-African Amer.): 40 — ABNORMAL LOW
Glucose: 159 mg/dL — ABNORMAL HIGH (ref 65–99)
Osmolality: 284 (ref 275–301)
Potassium: 3.3 mmol/L — ABNORMAL LOW (ref 3.5–5.1)
SGPT (ALT): 16 U/L (ref 12–78)
Sodium: 139 mmol/L (ref 136–145)
Total Protein: 6.9 g/dL (ref 6.4–8.2)

## 2013-01-02 LAB — TSH: Thyroid Stimulating Horm: 0.696 u[IU]/mL

## 2013-01-03 LAB — BASIC METABOLIC PANEL
Anion Gap: 2 — ABNORMAL LOW (ref 7–16)
BUN: 31 mg/dL — ABNORMAL HIGH (ref 7–18)
Calcium, Total: 8.7 mg/dL (ref 8.5–10.1)
EGFR (African American): 40 — ABNORMAL LOW
EGFR (Non-African Amer.): 34 — ABNORMAL LOW
Potassium: 3.6 mmol/L (ref 3.5–5.1)

## 2013-01-04 DIAGNOSIS — I059 Rheumatic mitral valve disease, unspecified: Secondary | ICD-10-CM

## 2013-01-06 LAB — CULTURE, BLOOD (SINGLE)

## 2013-01-12 ENCOUNTER — Inpatient Hospital Stay: Payer: Self-pay | Admitting: Internal Medicine

## 2013-01-12 LAB — BASIC METABOLIC PANEL
Anion Gap: 6 — ABNORMAL LOW (ref 7–16)
Calcium, Total: 8.8 mg/dL (ref 8.5–10.1)
Co2: 25 mmol/L (ref 21–32)
EGFR (African American): 48 — ABNORMAL LOW
EGFR (Non-African Amer.): 41 — ABNORMAL LOW
Glucose: 81 mg/dL (ref 65–99)
Sodium: 141 mmol/L (ref 136–145)

## 2013-01-12 LAB — CBC
HCT: 37.5 % — ABNORMAL LOW (ref 40.0–52.0)
MCH: 23.6 pg — ABNORMAL LOW (ref 26.0–34.0)
MCV: 76 fL — ABNORMAL LOW (ref 80–100)
Platelet: 221 10*3/uL (ref 150–440)
RDW: 20.3 % — ABNORMAL HIGH (ref 11.5–14.5)

## 2013-01-12 LAB — PRO B NATRIURETIC PEPTIDE: B-Type Natriuretic Peptide: 33456 pg/mL — ABNORMAL HIGH (ref 0–125)

## 2013-01-13 LAB — CBC WITH DIFFERENTIAL/PLATELET
Basophil #: 0.1 10*3/uL (ref 0.0–0.1)
Eosinophil #: 0.1 10*3/uL (ref 0.0–0.7)
Eosinophil %: 1.3 %
HCT: 34.9 % — ABNORMAL LOW (ref 40.0–52.0)
HGB: 11.2 g/dL — ABNORMAL LOW (ref 13.0–18.0)
Lymphocyte #: 0.8 10*3/uL — ABNORMAL LOW (ref 1.0–3.6)
MCH: 23.7 pg — ABNORMAL LOW (ref 26.0–34.0)
MCV: 74 fL — ABNORMAL LOW (ref 80–100)
Monocyte #: 0.8 x10 3/mm (ref 0.2–1.0)
Monocyte %: 10.3 %
Neutrophil #: 6.2 10*3/uL (ref 1.4–6.5)
Neutrophil %: 77.4 %
Platelet: 202 10*3/uL (ref 150–440)
RDW: 19.3 % — ABNORMAL HIGH (ref 11.5–14.5)

## 2013-01-13 LAB — BASIC METABOLIC PANEL
Anion Gap: 3 — ABNORMAL LOW (ref 7–16)
BUN: 27 mg/dL — ABNORMAL HIGH (ref 7–18)
Calcium, Total: 8.8 mg/dL (ref 8.5–10.1)
Chloride: 107 mmol/L (ref 98–107)
Co2: 27 mmol/L (ref 21–32)
Creatinine: 1.85 mg/dL — ABNORMAL HIGH (ref 0.60–1.30)
Creatinine: 1.89 mg/dL — ABNORMAL HIGH (ref 0.60–1.30)
EGFR (African American): 47 — ABNORMAL LOW
EGFR (African American): 46 — ABNORMAL LOW
EGFR (Non-African Amer.): 41 — ABNORMAL LOW
EGFR (Non-African Amer.): 40 — ABNORMAL LOW
Osmolality: 288 (ref 275–301)
Osmolality: 287 (ref 275–301)
Potassium: 3.9 mmol/L (ref 3.5–5.1)
Potassium: 3.6 mmol/L (ref 3.5–5.1)

## 2013-01-13 LAB — TROPONIN I: Troponin-I: 0.04 ng/mL

## 2013-01-14 LAB — BASIC METABOLIC PANEL
Anion Gap: 4 — ABNORMAL LOW (ref 7–16)
BUN: 32 mg/dL — ABNORMAL HIGH (ref 7–18)
Co2: 30 mmol/L (ref 21–32)
EGFR (African American): 45 — ABNORMAL LOW
EGFR (Non-African Amer.): 39 — ABNORMAL LOW
Potassium: 3.5 mmol/L (ref 3.5–5.1)
Sodium: 139 mmol/L (ref 136–145)

## 2013-01-14 LAB — MAGNESIUM: Magnesium: 2.1 mg/dL

## 2013-01-14 LAB — IRON AND TIBC
Iron: 27 ug/dL — ABNORMAL LOW (ref 65–175)
Unbound Iron-Bind.Cap.: 261 ug/dL

## 2013-01-14 LAB — FERRITIN: Ferritin (ARMC): 104 ng/mL (ref 8–388)

## 2013-02-20 ENCOUNTER — Inpatient Hospital Stay: Payer: Self-pay | Admitting: Internal Medicine

## 2013-02-20 LAB — CK TOTAL AND CKMB (NOT AT ARMC)
CK, Total: 177 U/L (ref 35–232)
CK, Total: 137 U/L (ref 35–232)
CK-MB: 5.7 ng/mL — AB (ref 0.5–3.6)
CK-MB: 4.1 ng/mL — ABNORMAL HIGH (ref 0.5–3.6)

## 2013-02-20 LAB — BASIC METABOLIC PANEL
Anion Gap: 2 — ABNORMAL LOW (ref 7–16)
BUN: 47 mg/dL — AB (ref 7–18)
CALCIUM: 9 mg/dL (ref 8.5–10.1)
Chloride: 108 mmol/L — ABNORMAL HIGH (ref 98–107)
Co2: 28 mmol/L (ref 21–32)
Creatinine: 2.18 mg/dL — ABNORMAL HIGH (ref 0.60–1.30)
EGFR (African American): 38 — ABNORMAL LOW
EGFR (Non-African Amer.): 33 — ABNORMAL LOW
GLUCOSE: 86 mg/dL (ref 65–99)
OSMOLALITY: 287 (ref 275–301)
Potassium: 4.1 mmol/L (ref 3.5–5.1)
SODIUM: 138 mmol/L (ref 136–145)

## 2013-02-20 LAB — CBC
HCT: 45.7 % (ref 40.0–52.0)
HGB: 14.2 g/dL (ref 13.0–18.0)
MCH: 24.1 pg — ABNORMAL LOW (ref 26.0–34.0)
MCHC: 31 g/dL — ABNORMAL LOW (ref 32.0–36.0)
MCV: 78 fL — ABNORMAL LOW (ref 80–100)
Platelet: 148 10*3/uL — ABNORMAL LOW (ref 150–440)
RBC: 5.89 10*6/uL (ref 4.40–5.90)
RDW: 22 % — ABNORMAL HIGH (ref 11.5–14.5)
WBC: 9.9 10*3/uL (ref 3.8–10.6)

## 2013-02-20 LAB — TROPONIN I
Troponin-I: 0.02 ng/mL
Troponin-I: 0.03 ng/mL

## 2013-02-20 LAB — PRO B NATRIURETIC PEPTIDE: B-Type Natriuretic Peptide: 26752 pg/mL — ABNORMAL HIGH (ref 0–125)

## 2013-02-21 LAB — BASIC METABOLIC PANEL
ANION GAP: 2 — AB (ref 7–16)
BUN: 42 mg/dL — ABNORMAL HIGH (ref 7–18)
CO2: 29 mmol/L (ref 21–32)
CREATININE: 1.99 mg/dL — AB (ref 0.60–1.30)
Calcium, Total: 8.8 mg/dL (ref 8.5–10.1)
Chloride: 107 mmol/L (ref 98–107)
GFR CALC AF AMER: 43 — AB
GFR CALC NON AF AMER: 37 — AB
Glucose: 132 mg/dL — ABNORMAL HIGH (ref 65–99)
Osmolality: 288 (ref 275–301)
Potassium: 4.4 mmol/L (ref 3.5–5.1)
Sodium: 138 mmol/L (ref 136–145)

## 2013-02-21 LAB — CBC WITH DIFFERENTIAL/PLATELET
BASOS ABS: 0 10*3/uL (ref 0.0–0.1)
Basophil %: 0.2 %
EOS PCT: 0 %
Eosinophil #: 0 10*3/uL (ref 0.0–0.7)
HCT: 43.2 % (ref 40.0–52.0)
HGB: 13.7 g/dL (ref 13.0–18.0)
LYMPHS ABS: 0.3 10*3/uL — AB (ref 1.0–3.6)
LYMPHS PCT: 4.8 %
MCH: 24.2 pg — ABNORMAL LOW (ref 26.0–34.0)
MCHC: 31.7 g/dL — ABNORMAL LOW (ref 32.0–36.0)
MCV: 76 fL — AB (ref 80–100)
Monocyte #: 0.3 x10 3/mm (ref 0.2–1.0)
Monocyte %: 4.8 %
Neutrophil #: 5 10*3/uL (ref 1.4–6.5)
Neutrophil %: 90.2 %
Platelet: 153 10*3/uL (ref 150–440)
RBC: 5.65 10*6/uL (ref 4.40–5.90)
RDW: 21.8 % — ABNORMAL HIGH (ref 11.5–14.5)
WBC: 5.5 10*3/uL (ref 3.8–10.6)

## 2013-02-21 LAB — TROPONIN I: Troponin-I: 0.02 ng/mL

## 2013-02-21 LAB — CK TOTAL AND CKMB (NOT AT ARMC)
CK, Total: 126 U/L
CK-MB: 3.8 ng/mL — AB (ref 0.5–3.6)

## 2013-02-23 LAB — BASIC METABOLIC PANEL
Anion Gap: 7 (ref 7–16)
BUN: 64 mg/dL — ABNORMAL HIGH (ref 7–18)
CALCIUM: 9.2 mg/dL (ref 8.5–10.1)
CHLORIDE: 100 mmol/L (ref 98–107)
CO2: 28 mmol/L (ref 21–32)
Creatinine: 2.41 mg/dL — ABNORMAL HIGH (ref 0.60–1.30)
EGFR (Non-African Amer.): 29 — ABNORMAL LOW
GFR CALC AF AMER: 34 — AB
Glucose: 185 mg/dL — ABNORMAL HIGH (ref 65–99)
Osmolality: 293 (ref 275–301)
POTASSIUM: 3.9 mmol/L (ref 3.5–5.1)
Sodium: 135 mmol/L — ABNORMAL LOW (ref 136–145)

## 2013-04-02 ENCOUNTER — Inpatient Hospital Stay: Payer: Self-pay | Admitting: Specialist

## 2013-04-02 LAB — COMPREHENSIVE METABOLIC PANEL
ALK PHOS: 71 U/L
AST: 19 U/L (ref 15–37)
Albumin: 2.9 g/dL — ABNORMAL LOW (ref 3.4–5.0)
Anion Gap: 6 — ABNORMAL LOW (ref 7–16)
BUN: 15 mg/dL (ref 7–18)
Bilirubin,Total: 0.9 mg/dL (ref 0.2–1.0)
Calcium, Total: 8.6 mg/dL (ref 8.5–10.1)
Chloride: 111 mmol/L — ABNORMAL HIGH (ref 98–107)
Co2: 26 mmol/L (ref 21–32)
Creatinine: 1.79 mg/dL — ABNORMAL HIGH (ref 0.60–1.30)
EGFR (African American): 49 — ABNORMAL LOW
EGFR (Non-African Amer.): 42 — ABNORMAL LOW
Glucose: 128 mg/dL — ABNORMAL HIGH (ref 65–99)
OSMOLALITY: 287 (ref 275–301)
POTASSIUM: 3.7 mmol/L (ref 3.5–5.1)
SGPT (ALT): 18 U/L (ref 12–78)
SODIUM: 143 mmol/L (ref 136–145)
Total Protein: 6.7 g/dL (ref 6.4–8.2)

## 2013-04-02 LAB — URINALYSIS, COMPLETE
BILIRUBIN, UR: NEGATIVE
Bacteria: NONE SEEN
Blood: NEGATIVE
Glucose,UR: NEGATIVE mg/dL (ref 0–75)
Hyaline Cast: 2
KETONE: NEGATIVE
LEUKOCYTE ESTERASE: NEGATIVE
Nitrite: NEGATIVE
Ph: 6 (ref 4.5–8.0)
RBC,UR: <1 /HPF (ref 0–5)
Specific Gravity: 1.017 (ref 1.003–1.030)
Squamous Epithelial: NONE SEEN
WBC UR: <1 /HPF (ref 0–5)

## 2013-04-02 LAB — PRO B NATRIURETIC PEPTIDE: B-Type Natriuretic Peptide: 32288 pg/mL — ABNORMAL HIGH (ref 0–125)

## 2013-04-02 LAB — TROPONIN I: Troponin-I: 0.03 ng/mL

## 2013-04-02 LAB — CBC
HCT: 40.2 % (ref 40.0–52.0)
HGB: 12.9 g/dL — AB (ref 13.0–18.0)
MCH: 25 pg — ABNORMAL LOW (ref 26.0–34.0)
MCHC: 32.2 g/dL (ref 32.0–36.0)
MCV: 78 fL — ABNORMAL LOW (ref 80–100)
Platelet: 188 10*3/uL (ref 150–440)
RBC: 5.18 10*6/uL (ref 4.40–5.90)
RDW: 23.1 % — ABNORMAL HIGH (ref 11.5–14.5)
WBC: 7.1 10*3/uL (ref 3.8–10.6)

## 2013-04-02 LAB — MAGNESIUM: MAGNESIUM: 1.7 mg/dL — AB

## 2013-04-03 LAB — CBC WITH DIFFERENTIAL/PLATELET
BASOS ABS: 0 10*3/uL (ref 0.0–0.1)
Basophil %: 0.6 %
EOS PCT: 0.4 %
Eosinophil #: 0 10*3/uL (ref 0.0–0.7)
HCT: 38.9 % — ABNORMAL LOW (ref 40.0–52.0)
HGB: 12.3 g/dL — ABNORMAL LOW (ref 13.0–18.0)
LYMPHS ABS: 0.8 10*3/uL — AB (ref 1.0–3.6)
Lymphocyte %: 11.6 %
MCH: 24.5 pg — ABNORMAL LOW (ref 26.0–34.0)
MCHC: 31.5 g/dL — ABNORMAL LOW (ref 32.0–36.0)
MCV: 78 fL — ABNORMAL LOW (ref 80–100)
Monocyte #: 0.7 x10 3/mm (ref 0.2–1.0)
Monocyte %: 11 %
NEUTROS ABS: 5.1 10*3/uL (ref 1.4–6.5)
Neutrophil %: 76.4 %
PLATELETS: 184 10*3/uL (ref 150–440)
RBC: 5 10*6/uL (ref 4.40–5.90)
RDW: 22.8 % — ABNORMAL HIGH (ref 11.5–14.5)
WBC: 6.7 10*3/uL (ref 3.8–10.6)

## 2013-04-03 LAB — BASIC METABOLIC PANEL
ANION GAP: 5 — AB (ref 7–16)
BUN: 20 mg/dL — AB (ref 7–18)
CALCIUM: 8 mg/dL — AB (ref 8.5–10.1)
CHLORIDE: 108 mmol/L — AB (ref 98–107)
CO2: 28 mmol/L (ref 21–32)
CREATININE: 2 mg/dL — AB (ref 0.60–1.30)
EGFR (African American): 43 — ABNORMAL LOW
GFR CALC NON AF AMER: 37 — AB
Glucose: 143 mg/dL — ABNORMAL HIGH (ref 65–99)
Osmolality: 286 (ref 275–301)
Potassium: 3.5 mmol/L (ref 3.5–5.1)
SODIUM: 141 mmol/L (ref 136–145)

## 2013-04-03 LAB — MAGNESIUM: Magnesium: 1.8 mg/dL

## 2013-04-04 LAB — BASIC METABOLIC PANEL
ANION GAP: 2 — AB (ref 7–16)
BUN: 20 mg/dL — ABNORMAL HIGH (ref 7–18)
CALCIUM: 8.1 mg/dL — AB (ref 8.5–10.1)
CHLORIDE: 105 mmol/L (ref 98–107)
CO2: 31 mmol/L (ref 21–32)
Creatinine: 1.93 mg/dL — ABNORMAL HIGH (ref 0.60–1.30)
EGFR (African American): 44 — ABNORMAL LOW
EGFR (Non-African Amer.): 38 — ABNORMAL LOW
Glucose: 84 mg/dL (ref 65–99)
Osmolality: 277 (ref 275–301)
POTASSIUM: 3.5 mmol/L (ref 3.5–5.1)
Sodium: 138 mmol/L (ref 136–145)

## 2013-04-07 LAB — CULTURE, BLOOD (SINGLE)

## 2013-04-25 ENCOUNTER — Inpatient Hospital Stay: Payer: Self-pay | Admitting: Internal Medicine

## 2013-04-25 LAB — CBC
HCT: 37.6 % — ABNORMAL LOW (ref 40.0–52.0)
HGB: 11.9 g/dL — ABNORMAL LOW (ref 13.0–18.0)
MCH: 25.2 pg — ABNORMAL LOW (ref 26.0–34.0)
MCHC: 31.7 g/dL — AB (ref 32.0–36.0)
MCV: 79 fL — ABNORMAL LOW (ref 80–100)
Platelet: 122 10*3/uL — ABNORMAL LOW (ref 150–440)
RBC: 4.73 10*6/uL (ref 4.40–5.90)
RDW: 21.4 % — ABNORMAL HIGH (ref 11.5–14.5)
WBC: 8.3 10*3/uL (ref 3.8–10.6)

## 2013-04-25 LAB — URINALYSIS, COMPLETE
Bacteria: NONE SEEN
Bilirubin,UR: NEGATIVE
GLUCOSE, UR: NEGATIVE mg/dL (ref 0–75)
Ketone: NEGATIVE
Leukocyte Esterase: NEGATIVE
Nitrite: NEGATIVE
Ph: 5 (ref 4.5–8.0)
RBC,UR: 1 /HPF (ref 0–5)
Specific Gravity: 1.015 (ref 1.003–1.030)
Squamous Epithelial: NONE SEEN
WBC UR: <1 /HPF (ref 0–5)

## 2013-04-25 LAB — COMPREHENSIVE METABOLIC PANEL
ALT: 17 U/L (ref 12–78)
AST: 21 U/L (ref 15–37)
Albumin: 3 g/dL — ABNORMAL LOW (ref 3.4–5.0)
Alkaline Phosphatase: 64 U/L
Anion Gap: 5 — ABNORMAL LOW (ref 7–16)
BILIRUBIN TOTAL: 1.2 mg/dL — AB (ref 0.2–1.0)
BUN: 22 mg/dL — ABNORMAL HIGH (ref 7–18)
Calcium, Total: 8.7 mg/dL (ref 8.5–10.1)
Chloride: 108 mmol/L — ABNORMAL HIGH (ref 98–107)
Co2: 26 mmol/L (ref 21–32)
Creatinine: 2.13 mg/dL — ABNORMAL HIGH (ref 0.60–1.30)
EGFR (Non-African Amer.): 34 — ABNORMAL LOW
GFR CALC AF AMER: 39 — AB
Glucose: 87 mg/dL (ref 65–99)
OSMOLALITY: 280 (ref 275–301)
Potassium: 3.9 mmol/L (ref 3.5–5.1)
SODIUM: 139 mmol/L (ref 136–145)
TOTAL PROTEIN: 6.8 g/dL (ref 6.4–8.2)

## 2013-04-25 LAB — DRUG SCREEN, URINE
Amphetamines, Ur Screen: NEGATIVE (ref ?–1000)
Barbiturates, Ur Screen: NEGATIVE (ref ?–200)
Benzodiazepine, Ur Scrn: NEGATIVE (ref ?–200)
CANNABINOID 50 NG, UR ~~LOC~~: NEGATIVE (ref ?–50)
COCAINE METABOLITE, UR ~~LOC~~: NEGATIVE (ref ?–300)
MDMA (Ecstasy)Ur Screen: NEGATIVE (ref ?–500)
Methadone, Ur Screen: NEGATIVE (ref ?–300)
Opiate, Ur Screen: POSITIVE (ref ?–300)
PHENCYCLIDINE (PCP) UR S: NEGATIVE (ref ?–25)
TRICYCLIC, UR SCREEN: NEGATIVE (ref ?–1000)

## 2013-04-25 LAB — RAPID HIV-1/2 QL/CONFIRM: HIV-1/2,Rapid Ql: NEGATIVE

## 2013-04-25 LAB — TROPONIN I
TROPONIN-I: 0.1 ng/mL — AB
TROPONIN-I: 0.1 ng/mL — AB
Troponin-I: 0.08 ng/mL — ABNORMAL HIGH

## 2013-04-25 LAB — PRO B NATRIURETIC PEPTIDE: B-TYPE NATIURETIC PEPTID: 32727 pg/mL — AB (ref 0–125)

## 2013-04-26 LAB — CBC WITH DIFFERENTIAL/PLATELET
BASOS ABS: 0 10*3/uL (ref 0.0–0.1)
Basophil %: 0.2 %
Eosinophil #: 0 10*3/uL (ref 0.0–0.7)
Eosinophil %: 0 %
HCT: 37.7 % — AB (ref 40.0–52.0)
HGB: 12.1 g/dL — ABNORMAL LOW (ref 13.0–18.0)
LYMPHS PCT: 5.6 %
Lymphocyte #: 0.3 10*3/uL — ABNORMAL LOW (ref 1.0–3.6)
MCH: 25.5 pg — ABNORMAL LOW (ref 26.0–34.0)
MCHC: 32.1 g/dL (ref 32.0–36.0)
MCV: 79 fL — ABNORMAL LOW (ref 80–100)
MONOS PCT: 1.8 %
Monocyte #: 0.1 x10 3/mm — ABNORMAL LOW (ref 0.2–1.0)
NEUTROS PCT: 92.4 %
Neutrophil #: 4.5 10*3/uL (ref 1.4–6.5)
Platelet: 124 10*3/uL — ABNORMAL LOW (ref 150–440)
RBC: 4.75 10*6/uL (ref 4.40–5.90)
RDW: 21.4 % — ABNORMAL HIGH (ref 11.5–14.5)
WBC: 4.9 10*3/uL (ref 3.8–10.6)

## 2013-04-26 LAB — BASIC METABOLIC PANEL
Anion Gap: 7 (ref 7–16)
BUN: 30 mg/dL — ABNORMAL HIGH (ref 7–18)
CALCIUM: 8.2 mg/dL — AB (ref 8.5–10.1)
CREATININE: 2.1 mg/dL — AB (ref 0.60–1.30)
Chloride: 106 mmol/L (ref 98–107)
Co2: 24 mmol/L (ref 21–32)
EGFR (African American): 40 — ABNORMAL LOW
EGFR (Non-African Amer.): 35 — ABNORMAL LOW
GLUCOSE: 259 mg/dL — AB (ref 65–99)
Osmolality: 289 (ref 275–301)
Potassium: 4.1 mmol/L (ref 3.5–5.1)
SODIUM: 137 mmol/L (ref 136–145)

## 2013-04-27 LAB — BASIC METABOLIC PANEL
ANION GAP: 7 (ref 7–16)
BUN: 36 mg/dL — ABNORMAL HIGH (ref 7–18)
Calcium, Total: 8.3 mg/dL — ABNORMAL LOW (ref 8.5–10.1)
Chloride: 103 mmol/L (ref 98–107)
Co2: 27 mmol/L (ref 21–32)
Creatinine: 2.09 mg/dL — ABNORMAL HIGH (ref 0.60–1.30)
EGFR (African American): 40 — ABNORMAL LOW
GFR CALC NON AF AMER: 35 — AB
Glucose: 333 mg/dL — ABNORMAL HIGH (ref 65–99)
Osmolality: 295 (ref 275–301)
POTASSIUM: 3.8 mmol/L (ref 3.5–5.1)
SODIUM: 137 mmol/L (ref 136–145)

## 2013-04-28 LAB — BASIC METABOLIC PANEL
Anion Gap: 6 — ABNORMAL LOW (ref 7–16)
BUN: 35 mg/dL — AB (ref 7–18)
CALCIUM: 8.2 mg/dL — AB (ref 8.5–10.1)
CREATININE: 1.85 mg/dL — AB (ref 0.60–1.30)
Chloride: 104 mmol/L (ref 98–107)
Co2: 29 mmol/L (ref 21–32)
GFR CALC AF AMER: 47 — AB
GFR CALC NON AF AMER: 40 — AB
Glucose: 150 mg/dL — ABNORMAL HIGH (ref 65–99)
Osmolality: 288 (ref 275–301)
Potassium: 3.6 mmol/L (ref 3.5–5.1)
Sodium: 139 mmol/L (ref 136–145)

## 2013-04-28 LAB — MAGNESIUM: Magnesium: 2.1 mg/dL

## 2013-04-29 LAB — POTASSIUM: Potassium: 4.1 mmol/L (ref 3.5–5.1)

## 2013-04-29 LAB — MAGNESIUM: Magnesium: 2.1 mg/dL

## 2013-04-30 LAB — CULTURE, BLOOD (SINGLE)

## 2013-04-30 LAB — EXPECTORATED SPUTUM ASSESSMENT W GRAM STAIN, RFLX TO RESP C

## 2013-06-25 ENCOUNTER — Inpatient Hospital Stay: Payer: Self-pay | Admitting: Internal Medicine

## 2013-06-25 LAB — BASIC METABOLIC PANEL
Anion Gap: 9 (ref 7–16)
BUN: 21 mg/dL — ABNORMAL HIGH (ref 7–18)
CO2: 25 mmol/L (ref 21–32)
Calcium, Total: 8.6 mg/dL (ref 8.5–10.1)
Chloride: 108 mmol/L — ABNORMAL HIGH (ref 98–107)
Creatinine: 1.83 mg/dL — ABNORMAL HIGH (ref 0.60–1.30)
EGFR (Non-African Amer.): 41 — ABNORMAL LOW
GFR CALC AF AMER: 47 — AB
Glucose: 105 mg/dL — ABNORMAL HIGH (ref 65–99)
OSMOLALITY: 286 (ref 275–301)
POTASSIUM: 3.2 mmol/L — AB (ref 3.5–5.1)
SODIUM: 142 mmol/L (ref 136–145)

## 2013-06-25 LAB — CBC
HCT: 41 % (ref 40.0–52.0)
HGB: 13 g/dL (ref 13.0–18.0)
MCH: 25.7 pg — ABNORMAL LOW (ref 26.0–34.0)
MCHC: 31.7 g/dL — ABNORMAL LOW (ref 32.0–36.0)
MCV: 81 fL (ref 80–100)
Platelet: 157 10*3/uL (ref 150–440)
RBC: 5.06 10*6/uL (ref 4.40–5.90)
RDW: 18.4 % — AB (ref 11.5–14.5)
WBC: 10.9 10*3/uL — ABNORMAL HIGH (ref 3.8–10.6)

## 2013-06-25 LAB — TROPONIN I: Troponin-I: 0.03 ng/mL

## 2013-06-26 LAB — COMPREHENSIVE METABOLIC PANEL
ALT: 13 U/L (ref 12–78)
ANION GAP: 7 (ref 7–16)
AST: 10 U/L — AB (ref 15–37)
Albumin: 2.4 g/dL — ABNORMAL LOW (ref 3.4–5.0)
Alkaline Phosphatase: 53 U/L
BUN: 27 mg/dL — ABNORMAL HIGH (ref 7–18)
Bilirubin,Total: 0.9 mg/dL (ref 0.2–1.0)
CALCIUM: 8.3 mg/dL — AB (ref 8.5–10.1)
Chloride: 106 mmol/L (ref 98–107)
Co2: 25 mmol/L (ref 21–32)
Creatinine: 2.3 mg/dL — ABNORMAL HIGH (ref 0.60–1.30)
EGFR (African American): 36 — ABNORMAL LOW
EGFR (Non-African Amer.): 31 — ABNORMAL LOW
GLUCOSE: 142 mg/dL — AB (ref 65–99)
OSMOLALITY: 283 (ref 275–301)
Potassium: 3.6 mmol/L (ref 3.5–5.1)
Sodium: 138 mmol/L (ref 136–145)
Total Protein: 5.8 g/dL — ABNORMAL LOW (ref 6.4–8.2)

## 2013-06-26 LAB — CBC WITH DIFFERENTIAL/PLATELET
BASOS ABS: 0 10*3/uL (ref 0.0–0.1)
Basophil %: 0.4 %
Eosinophil #: 0.1 10*3/uL (ref 0.0–0.7)
Eosinophil %: 1.5 %
HCT: 37.1 % — AB (ref 40.0–52.0)
HGB: 11.5 g/dL — AB (ref 13.0–18.0)
LYMPHS ABS: 0.8 10*3/uL — AB (ref 1.0–3.6)
LYMPHS PCT: 9.2 %
MCH: 25.3 pg — ABNORMAL LOW (ref 26.0–34.0)
MCHC: 31.1 g/dL — ABNORMAL LOW (ref 32.0–36.0)
MCV: 81 fL (ref 80–100)
Monocyte #: 1 x10 3/mm (ref 0.2–1.0)
Monocyte %: 11.7 %
Neutrophil #: 6.6 10*3/uL — ABNORMAL HIGH (ref 1.4–6.5)
Neutrophil %: 77.2 %
Platelet: 149 10*3/uL — ABNORMAL LOW (ref 150–440)
RBC: 4.56 10*6/uL (ref 4.40–5.90)
RDW: 18.6 % — AB (ref 11.5–14.5)
WBC: 8.5 10*3/uL (ref 3.8–10.6)

## 2013-06-26 LAB — MAGNESIUM: Magnesium: 1.8 mg/dL

## 2013-06-26 LAB — TSH: THYROID STIMULATING HORM: 0.32 u[IU]/mL — AB

## 2013-06-27 LAB — BASIC METABOLIC PANEL
ANION GAP: 6 — AB (ref 7–16)
BUN: 35 mg/dL — ABNORMAL HIGH (ref 7–18)
CALCIUM: 8.5 mg/dL (ref 8.5–10.1)
Chloride: 106 mmol/L (ref 98–107)
Co2: 26 mmol/L (ref 21–32)
GLUCOSE: 140 mg/dL — AB (ref 65–99)
Osmolality: 286 (ref 275–301)
Potassium: 3.9 mmol/L (ref 3.5–5.1)
SODIUM: 138 mmol/L (ref 136–145)

## 2013-06-27 LAB — CREATININE, SERUM
Creatinine: 2.7 mg/dL — ABNORMAL HIGH (ref 0.60–1.30)
EGFR (African American): 30 — ABNORMAL LOW
EGFR (Non-African Amer.): 26 — ABNORMAL LOW

## 2013-06-27 LAB — VANCOMYCIN, TROUGH: Vancomycin, Trough: 16 ug/mL (ref 10–20)

## 2013-06-28 LAB — BASIC METABOLIC PANEL
Anion Gap: 5 — ABNORMAL LOW (ref 7–16)
BUN: 24 mg/dL — ABNORMAL HIGH (ref 7–18)
CALCIUM: 8.7 mg/dL (ref 8.5–10.1)
CHLORIDE: 108 mmol/L — AB (ref 98–107)
CO2: 25 mmol/L (ref 21–32)
Glucose: 204 mg/dL — ABNORMAL HIGH (ref 65–99)
OSMOLALITY: 286 (ref 275–301)
Potassium: 4.2 mmol/L (ref 3.5–5.1)
Sodium: 138 mmol/L (ref 136–145)

## 2013-06-28 LAB — VANCOMYCIN, TROUGH: Vancomycin, Trough: 15 ug/mL (ref 10–20)

## 2013-06-28 LAB — CREATININE, SERUM
Creatinine: 2.03 mg/dL — ABNORMAL HIGH (ref 0.60–1.30)
EGFR (African American): 42 — ABNORMAL LOW
EGFR (Non-African Amer.): 36 — ABNORMAL LOW

## 2013-06-28 LAB — EXPECTORATED SPUTUM ASSESSMENT W REFEX TO RESP CULTURE

## 2013-06-30 LAB — CULTURE, BLOOD (SINGLE)

## 2013-08-29 IMAGING — CR DG CHEST 1V PORT
1 series · 1 of 1 positions shown · non-contrast
Comparison: none

REASON FOR EXAM: sob
COMMENTS:

[view not recorded]
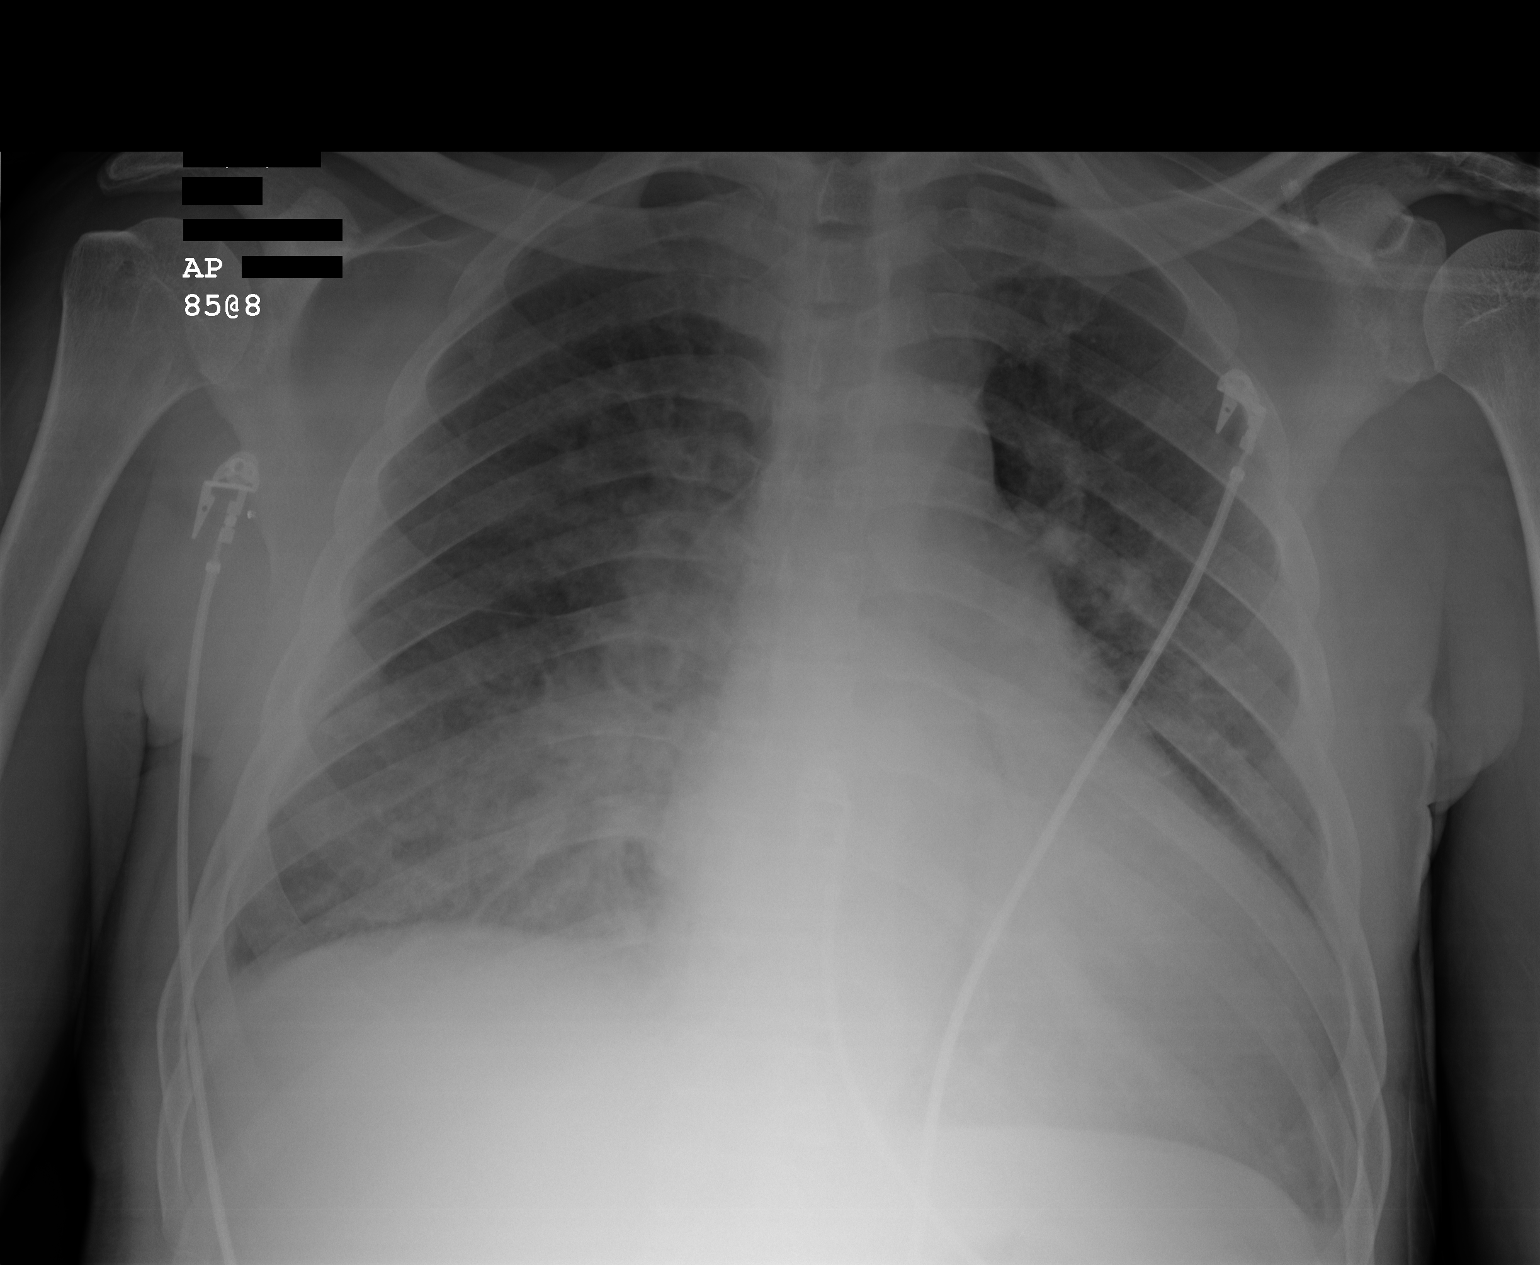

[1 of 1 positions shown; findings below may reference images not displayed]

PROCEDURE:     DXR - DXR PORTABLE CHEST SINGLE VIEW  - October 24, 2010  [DATE]

RESULT:     Comparison is made to study 01 October, 2010.

The cardiac silhouette is mildly enlarged. The pulmonary vascularity is
engorged. There is confluent density in the right infrahilar region which is
new. There is no pleural effusion.
IMPRESSION: The findings are consistent with likely acute and chronic
CHF. Superimposed pneumonia in the right infrahilar region may be present as
well. A followup PA and lateral chest x-ray would be of value.

## 2013-09-19 DEATH — deceased

## 2014-05-08 NOTE — Consult Note (Signed)
PATIENT NAME:  Drew Lynch, Drew Lynch MR#:  [id number] DATE OF BIRTH:  09-Aug-1960  DATE OF CONSULTATION:  11/26/2011  REFERRING PHYSICIAN:  Dr. Randol Kern   CONSULTING PHYSICIAN:  Lamar Blinks, MD  REASON FOR CONSULTATION: Acute on chronic congestive heart failure with pleural effusion.   CHIEF COMPLAINT: "I got short of breath."   HISTORY OF PRESENT ILLNESS: This is a 54 year old male with known severe dilated cardiomyopathy with congestive heart failure and valvular heart disease with chronic atrial fibrillation and chronic kidney disease and pleural effusion who has had appropriate therapy with appropriate medication management but has had recent worsening shortness of breath with lower extremity edema and pleural effusion. The patient was seen in the Emergency Room. At that time the chest x-ray showed pleural effusion as well as some pulmonary edema. The patient additionally has a creatinine of 2.5, essentially as it has been before.  BNP was 32,993 suggestive of acute on chronic congestive heart failure. The patient has been given some diuresis, which has helped overnight and improved. The patient also has had some oxygenation with current O2.  The patient has had an EKG showing atrial fibrillation with controlled ventricular rate on appropriate medications for control. He has not been on significant anticoagulation at this time due to issues of anemia and bleeding risks, and may further reconsider that in the future.   REVIEW OF SYSTEMS: Currently review of systems is negative for vision change, ringing in the ears, hearing loss, cough, congestion, heartburn, nausea, vomiting, diarrhea, bloody stools, stomach pain, extremity pain, leg weakness, cramping of the buttocks, known blood clots, headaches, blackouts, dizzy spells, nosebleeds, congestion, trouble swallowing, frequent urination, urination at night, muscle weakness, numbness, anxiety, depression, skin lesions, or skin rashes.   PAST MEDICAL  HISTORY:  1. Severe dilated cardiomyopathy.  2. Chronic atrial fibrillation.  3. Chronic kidney disease. 4. Tricuspid insufficiency.  5. Effusion. 6. Hypertension.   FAMILY HISTORY: No family members with early onset of cardiovascular disease.   SOCIAL HISTORY: Currently denies alcohol or tobacco use.   ALLERGIES: As listed.   PHYSICAL EXAMINATION:  VITAL SIGNS: Blood pressure is 110/68 bilaterally, heart rate 70 upright, reclining, and irregular.   GENERAL: He is a well-appearing male in no acute distress.   HEENT: No icterus, thyromegaly, ulcers, hemorrhage, or xanthelasma.   CARDIOVASCULAR: Irregularly irregular with normal S1 and S2. 2-3/6 apical murmur consistent with mitral regurgitation. Point of maximal impulse is diffuse. Carotid upstroke normal without bruit. Jugular venous pressure is normal.   LUNGS: Lungs have bibasilar crackles and decreased breath sounds.   ABDOMEN: Soft, nontender, without hepatosplenomegaly or masses. Abdominal aorta is normal size without bruit.   EXTREMITIES: 2+ radial, femoral, and dorsal pedal pulses with 1+ lower extremity edema. No cyanosis or clubbing.  NEUROLOGIC: He is oriented to time, place, and person with normal mood and affect.   ASSESSMENT: 54 year old male with chronic atrial fibrillation, tricuspid regurgitation, chronic kidney disease, pleural effusion, acute on chronic congestive heart failure with elevated troponin consistent with demand ischemia rather than acute myocardial infarction.   RECOMMENDATIONS:  1. Continuation of intravenous Lasix for further risk reduction in congestive heart failure and worsening pleural effusion.  2. Further consideration of thoracentesis to reduce pleural effusion, improvement of current symptoms of her shortness of breath.  3. Continue heart rate control with beta blocker for further risk reduction in addition to cardiomyopathy.  4. No anticoagulation at this time due to concerns of bleeding  complications with procedure.  5. No need  for further echocardiogram due to recent echocardiogram on recent hospitalization.  6. No further intervention. Minimal elevation of troponin is most consistent with demand ischemia.  7. Further treatment of chronic kidney disease as per nephrology.  8. Ambulate, if able, into chair and see if patient needs any additional medication management changes.      ____________________________ Lamar BlinksBruce J. Revin Corker, MD bjk:bjt D: 11/26/2011 08:26:15 ET T: 11/26/2011 11:30:56 ET JOB#: [id number]  cc: Lamar BlinksBruce J. Anabel Lykins, MD, <Dictator> Lamar BlinksBRUCE J Hebert Dooling MD ELECTRONICALLY SIGNED 11/26/2011 13:45

## 2014-05-08 NOTE — Consult Note (Signed)
Brief Consult Note: Diagnosis: shortness of breath/congestive heart failure/cardiomyopathy.   Patient was seen by consultant.   Consult note dictated.   Recommend further assessment or treatment.   Comments: 54 yo male with history of dilated cariomyopathy with ef of 10-15%, history of renal insuffiency who was admitted with progressive shortness of breath. He was admitted recently with right pleural effusion and airspace disease. . Since discharge it is unclear whether he been compliant with his meds but he was readmitted after presenting with increased shortness of breath and elevated bnp over his baseline. He was placed back on his carvedilol and ace i. He has improed with diuresis and restarting his medicaitons. Will continue to carefuly diurese and follow hemodynaics and renal funciton. EKG reveals afib with variable bentricular response. He is not anticoagulated as outpatient thus far. His CHADSS score is 2. Given compliance issues and possible repeat thoracentesis, would defer chronic anticoagulation at present. This will need to be discussed as outpatient with regard to compliance with meds and inr evaluation.  Full note to follow..  Electronic Signatures: Dalia HeadingFath, Kenneth A (MD)  (Signed 21-Nov-13 19:45)  Authored: Brief Consult Note   Last Updated: 21-Nov-13 19:45 by Dalia HeadingFath, Kenneth A (MD)

## 2014-05-08 NOTE — H&P (Signed)
PATIENT NAME:  Drew Lynch, MCGAHA MR#:  [id number] DATE OF BIRTH:  03-Jul-1960  DATE OF ADMISSION:  11/25/2011  PRIMARY CARE PHYSICIAN: [facility].   ED REFERRING PHYSICIAN: Glennie Isle, MD   CHIEF COMPLAINT: Shortness of breath.   HISTORY OF PRESENT ILLNESS: The patient is a 54 year old African American male with a history of severe systolic congestive heart failure with ejection fraction of 15%, also has severe mitral regurgitation, who was recently hospitalized last month with similar type of complaints. At that time he was thought to have acute systolic congestive heart failure exacerbation as well as possible right-sided pneumonia. He was treated and was discharged home. The patient states that he was doing okay. He does have chronic shortness of breath with minimal exertion. However, over the past few days his shortness of breath has gotten worse, and he is short of breath even without exerting himself. He states that he has continued to cough yellowish sputum but has not had any fevers or chills. He does not have any chest pains or palpitations. He also complains of increase in swelling in his lower extremity. He reports that he is able to lay flat and does not prop himself up. He only also uses one pillow. He otherwise denies any issues with urination. He reports that he has been urinating okay.   PAST MEDICAL HISTORY:  1. History of chronic atrial fibrillation, not felt to be a good anticoagulation candidate.  2. Severe cardiomyopathy based on an echocardiogram on 04/07/2011 which showed severe global hypokinesis of the left ventricle of 15%, moderate four-chamber enlargement.  3. Severe mitral and severe tricuspid regurgitation.  4. Hypertension.  5. Chronic obstructive pulmonary disease.  6. Gastroesophageal reflux disease.  7. Chronic kidney disease, is being followed by Dr. Lamont Dowdy.  8. Seizure disorder.   PAST SURGICAL HISTORY:  1. History of hernia. 2. Status post  laser surgery to the eyes.  ALLERGIES: Allergies to no medications.   HOME MEDICATIONS:  1. Advair 250/50 INH, 1 puff b.i.d.  2. Coreg 3.125, 1 tab p.o. b.i.d.  3. Lasix 20 mg, 1 tab p.o. b.i.d. 4. K-Dur 10 mEq daily.  5. Omeprazole 20 mg, 1 tab p.o. b.i.d.  6. Pravastatin 40 mg at bedtime.  7. Proventil 2 puffs every 4 hours p.r.n.  8. Quinapril 20, 1 tab p.o. b.i.d.   SOCIAL HISTORY: He quit smoking many years ago. No alcohol, no drug use. He lives with a friend and is currently employed.   FAMILY HISTORY: Mother died of colon cancer. Father died of diabetic complications.   REVIEW OF SYSTEMS: CONSTITUTIONAL: Denies any fevers or chills. Complains of weakness. Complains of weight gain, he is not sure how much. EYES: Denies any blurred vision or double vision. Currently, he does follow at [facility]. EARS, NOSE, MOUTH, AND THROAT: Denies any tinnitus. Complains of decrease in hearing. Has intermittent runny nose. No seasonal or year-round allergies. RESPIRATORY: Complains of shortness of breath. Complains of coughing yellowish sputum. Has chronic obstructive pulmonary disease, has previous history of pneumonia. CARDIOVASCULAR: Denies chest pains. Denies any palpitations or syncope. GASTROINTESTINAL: No nausea, vomiting, or diarrhea. Denies any hematemesis or hematochezia. GU: Denies any frequency, urgency, or hesitancy. MUSCULOSKELETAL: Denies any joint pains. No gout. SKIN: Denies any skin lesions. HEMATOLOGIC: Denies any easy bruisability or bleeding. NEUROLOGICAL: Denies any syncope, CVA, transient ischemic attacks or seizures. PSYCHIATRIC: No anxiety or depression. ENDOCRINE: Denies any polyuria or polyphagia. No thyroid problems.   PHYSICAL EXAMINATION:  VITAL SIGNS: Temperature  97.6, pulse 78, respirations 20, blood pressure 138/104.   GENERAL: The patient is an [place], is mildly uncomfortable due to his respiratory symptoms.   HEENT: Head atraumatic,  normocephalic. Pupils are equally round, reactive to light and accommodation. There is no conjunctival pallor.  Oropharynx: Lips appear dry but mouth is moist.   NECK: Positive for JVD. No carotid bruits. No lymphadenopathy. No thyromegaly.   RESPIRATORY: Decreased breath sounds bilaterally with occasional crackles and wheezing.   CARDIOVASCULAR: S1, S2 positive. A 2/6 systolic ejection murmur. PMI is not displaced.   ABDOMEN: Soft, nontender, nondistended. Positive bowel sounds x4.   EXTREMITIES: He has 2+ edema. Good DP, PT pulses.   LYMPHATICS: No lymph nodes palpable.   PSYCHIATRIC: Not anxious or depressed.   NEUROLOGICAL: Cranial nerves II through XII are grossly intact. No focal deficits.  LABORATORY, DIAGNOSTIC AND RADIOLOGICAL DATA: His chest x-ray shows stable cardiomegaly with interstitial infiltrates and increasing right pleural effusion. His CPK total was 169, CK-MB 4.3, glucose 112, BUN 24, creatinine 2.50, sodium 143, potassium 5.0, chloride 107, CO2 28. LFTs showed albumin of 2.9. His WBC count was 7.7, hemoglobin 13.6, platelet count 190. Troponin was 0.06.  BNP is 32,993. Magnesium is 1.9.   ASSESSMENT AND PLAN: The patient is a 54 year old African American male with history of severe cardiomyopathy, severe MR, who presents with shortness of breath.   1. Acute respiratory failure due to acute systolic congestive heart failure as well as worsening pleural effusion: At this time we will go ahead and treat him with IV Lasix. We will order for a paracentesis due to worsening pleural effusion. Likely related to congestive heart failure but need to rule out any other cause for the effusion. Also, the patient may have possible pneumonia based on his infiltrate, cough.  We will treat him with IV ceftriaxone and azithromycin. He also likely has an acute on chronic obstructive pulmonary disease flare, so will treat him with nebulizers and steroids.  2. Chronic atrial fibrillation: He  is not an anticoagulation candidate. We will place him on aspirin.  3. Acute renal failure on chronic renal failure: Possibly could be due to work, volume overload or worsening renal function. I will ask his nephrologist to come see the patient since he will be on Lasix. I will hold his quinapril for the time being in light of the acute renal failure.  4. Hyperlipidemia: Continue pravastatin.  5. Elevated cardiac enzymes: In light of his congestive heart failure, we will consult Cardiology. Follow his cardiac enzymes. It is likely related to demand ischemia from his severe cardiomyopathy, severe valvular disease.  6. Miscellaneous. Will place him on Lovenox for deep vein thrombosis prophylaxis.   Prognosis is very poor in light of severe cardiomyopathy and severe valvular disease.    TIME SPENT: 45 minutes spent.   ____________________________ Lacie ScottsShreyang H. Allena KatzPatel, MD shp:cbb D: 11/25/2011 16:33:04 ET T: 11/25/2011 16:51:05 ET JOB#: [id number]  cc: Naomia Lenderman H. Allena KatzPatel, MD, <Dictator> [facility] Molinda BailiffH Vint Pola MD ELECTRONICALLY SIGNED 12/03/2011 13:28

## 2014-05-08 NOTE — H&P (Signed)
PATIENT NAME:  Drew Lynch, [name] MR#:  [id number] DATE OF BIRTH:  1960/07/31  DATE OF ADMISSION:  12/09/2011  REFERRING PHYSICIAN: Dr. Glenetta HewMcLaurin.   [facility]  NEPHROLOGIST: Dr. Wynelle LinkKolluru.   CHIEF COMPLAINT: Shortness of breath.   HISTORY OF PRESENT ILLNESS: The patient is a 54 year old African American male with history of severe systolic congestive heart failure with ejection fraction of about 15% from March of this year with moderate four-chamber enlargement severe mitral regurgitation and tricuspid regurgitation with multiple hospitalizations in the last 3 to 4 months and got just discharged ten days ago for acute respiratory failure and congestive heart failure exacerbation and pleural effusion who had a thoracentesis done. Per ER physician, he had told her that he did not fill his prescription upon discharge. He told me that he has been taking his medications and has followed up with Dr. Wynelle LinkKolluru. He feels like he probably gained a little bit of weight, but does not know how much. He has been here in the ER on oxygen and when they ambulated him, his saturations dropped to 50 to 70. He feels some shortness of breath but appears to have some chronic shortness of breath on exertion per chart. He has no fevers or chills. Hospitalist services were contacted for further evaluation and management. Of note, his BNP is elevated at 42,844, which is higher than his previous hospitalizations. His creatinine clearances is stable and troponin is negative.   PAST MEDICAL HISTORY:  1. History of chronic atrial fibrillation, not on anticoagulation. He is a poor candidate with poor medication compliance.  2. Severe cardiomyopathy with chronic systolic congestive heart failure with ejection fraction of 15% with moderate four-chamber enlargement.  3. Severe mitral regurgitation.  4. Severe tricuspid regurgitation.  5. Hypertension.  6. Multiple recent hospitalizations for respiratory  failure, congestive heart failure and chronic obstructive pulmonary disease exacerbations.  7. Chronic obstructive pulmonary disease.  8. Gastroesophageal reflux disease.  9. Chronic kidney disease, stage III, with proteinuria.  10. Seizure disorder, not on any medications.   PAST SURGICAL HISTORY:  1. Hernia. 2. Laser surgery to both eyes.   ALLERGIES: None per chart and the patient.   SOCIAL HISTORY: Nonsmoker. No drug use. Occasional alcohol. Lives with her friend.   FAMILY HISTORY: Mom with colon cancer. Father died diabetes complications.   OUTPATIENT MEDICATIONS:  1. Advair 250/50, one puff inhaled twice a day.  2. Coreg 3.125 mg 1 tab 2 times a day.  3. Lasix 40 mg 2 times a day.  4. Klor-Con 10 mEq extended-release 1 tab daily.  5. Omeprazole 20 mg daily.  6. Pravastatin 40 mg daily.  7. Proventil HFA 2 puffs inhaled every four hours as needed for shortness of breath.  8. Quinapril 20 mg daily.  REVIEW OF SYSTEMS: CONSTITUTIONAL: No fever or fatigue. Positive for some weight gain. EYES: Has had laser surgery and has some blurry vision chronically. ENT: No tinnitus or hearing loss. RESPIRATORY: Positive for shortness of breath, cough with whitish sputum. Positive for history of pneumonia and chronic obstructive pulmonary disease and pleural effusion. CARDIOVASCULAR: No chest pain. No palpitations. History of atrial fibrillation. GASTROINTESTINAL: No nausea, vomiting x1 yesterday, some umbilical abdominal pain which he cannot quantify otherwise. GENITOURINARY: No hematuria, frequency or hesitancy.  MUSCULOSKELETAL: Has a history of gout. HEME: No bruising or bleeding. NEUROLOGICAL: History of seizures in the past. PSYCHIATRIC: Positive anxiety and depression. ENDOCRINE: No polyuria or polydipsia. No thyroid problems.   PHYSICAL EXAMINATION:  VITAL  SIGNS: Temperature 98.4, pulse rate 85, respiratory rate 24 on arrival, last one was 28, blood pressure 139/97 and oxygen saturation  94% on room air, but on ambulation, oxygen saturations did drop to 50s to 70s.   GENERAL: The patient is an unkempt chronically ill-appearing African American male lying in bed, talking in full sentences.   HEENT: Normocephalic, atraumatic. Pupils are equal and reactive. Moist mucous membranes. Extraocular muscles intact. Poor dentition.   NECK: Supple. No thyroid tenderness. No cervical lymphadenopathy.   CARDIOVASCULAR: S1, S2 irregularly irregular. Positive for murmurs at the base.   LUNGS: Decreased breath sounds and some crackles to mid lung on the right. Good air entry on the left.   ABDOMEN: Soft, nontender, nondistended. Positive bowel sounds in all quadrants.   EXTREMITIES: Positive for 2+ edema to mid shins.   NEUROLOGICAL: Cranial nerves II through XII grossly intact. Strength is 5/5 in all extremities.   PSYCH: Awake, alert, oriented x2-3.   SKIN: No obvious rashes.   LABORATORY, DIAGNOSTIC, AND RADIOLOGICAL DATA: Glucose 119. BNP elevated at 42,844, BUN 22, creatinine 2. Of note, it was 1.64 on 11/10 and 2.14 on 11/09. Sodium 141, potassium 3.8, phosphorus 2. Troponin negative. CK-MB negative. WBC 8.5, hemoglobin 14.2, platelets 162, MCV 76. EKG: Atrial fibrillation, left axis deviation, incomplete left bundle branch and some nonspecific T wave changes, prolonged QT. No acute ST elevations or depressions. X-ray of the chest, PA and lateral, showing improving aeration in the right hemithorax with decreased pleural effusion, stable cardiomegaly. There are small to moderate right pleural effusion. There is pulmonary vascular congestion.   ASSESSMENT AND PLAN: We have a 54 year old African American male with history of severe cardiomyopathy with chronic systolic congestive heart failure with ejection fraction of 15% with valvular disease in mitral valve and tricuspid valve with multiple recent hospitalizations, chronic obstructive pulmonary disease, CKD, stage III, with hypoxic  respiratory failure which appears to be acute. Admit the patient to the hospital. Of note, the patient's pleural effusion is less than prior. Although his BNP is significantly elevated than prior, his hypoxia with ambulation appears to be somewhat greater than what his volume status suggests. This makes me suspicious of a possible underlying PE as an alternate cause and given his renal failure which is chronic I am unable to do a CT scan at this point. I would put the patient on heparin for possible PE at this point. I would obtain an ultrasound of the lower extremities and obtain a V/Q scan in the morning and if it is negative or low probability, heparin could be stopped. I would start the patient on Lasix. Repeat an echocardiogram, obtain a cardiology as well as pulmonary consults and start the patient on nebulizers, morphine p.r.n. He does not have a fever or leukocytosis to suggest that he has a pneumonia. He, furthermore, does not have significant wheezing to suggest he is having acute chronic obstructive pulmonary disease exacerbation.   In regards to his chronic atrial fibrillation, he is not on chronic anticoagulation given compliance issues and not following up. He is on an aspirin here. We would defer to cardiology for further recommendations in this regard. His chronic kidney disease appears to be stable. His blood pressure is stable. Would continue his pravastatin. Overall, he appears to have a very poor prognosis given multiple hospitalizations and bouts of congestive heart failure, severe cardiomyopathy, valvular disease, chronic kidney disease and medication noncompliance.   CODE STATUS: The patient is FULL CODE.   TOTAL TIME  SPENT: 60 minutes.   ____________________________ Krystal Eaton, MD sa:ap D: 12/09/2011 21:07:21 ET T: 12/10/2011 07:06:15 ET JOB#: [id number]  cc: Krystal Eaton, MD, <Dictator> [facility] MD ELECTRONICALLY SIGNED 12/31/2011 11:09

## 2014-05-08 NOTE — H&P (Signed)
PATIENT NAME:  Drew Lynch, Drew Lynch MR#:  [id number] DATE OF BIRTH:  1961/01/10  DATE OF ADMISSION:  10/29/2011  PRIMARY CARE PHYSICIAN: [facility]   CHIEF COMPLAINT: Shortness of breath.   HISTORY OF PRESENT ILLNESS: The patient is a 54 year old man who saw the kidney doctor today and was sent in for further evaluation for heart failure. The patient [facility], did not fill his fluid pill; and he has been out of that for about a week. He does get short of breath and has to rest, then he is able to regain his breath. He has been having some weight gain but cannot quantify. He has a history of systolic congestive heart failure with cardiomyopathy, ejection fraction 15%, and severe mitral regurgitation. In the Emergency Room, he was found to have a low pulse oximetry as per the ER physician of 93% on oxygen. Chest x-ray was consistent with heart failure and BNP elevated at 25,254.   PAST MEDICAL HISTORY:  1. Chronic atrial fibrillation.  2. Cardiomyopathy with an ejection fraction of 15%.  3. Congestive heart failure with severe mitral regurgitation. 4. Hypertension.  5. Chronic obstructive pulmonary disease.  6. Gastroesophageal reflux disease.  7. Chronic kidney disease, stage 3.  8. Seizure disorder.   PAST SURGICAL HISTORY:  1. Hernia.  2. Laser surgery on the eye.   ALLERGIES: No known drug allergies.   MEDICATIONS: Medications that the patient is supposed to take are:  1. Advair Diskus 250/50, 1 inhalation b.i.d.   2. Aspirin 325 mg daily.  3. Coreg 3.125 mg b.i.d.  4. Lasix 20 mg daily.  5. K-Dur 10 mEq daily.  6. Omeprazole 20 mg b.i.d.  7. Pravastatin 40 mg at bedtime.  8. Proventil 2 puffs every 4 hours as needed for shortness of breath.  9. Quinapril 20 mg b.i.d.    SOCIAL HISTORY: He quit smoking many years ago. No alcohol. No drug use. He lives with a friend, currently not working.   FAMILY HISTORY: Mother died  of colon cancer. Father died of diabetic complications.   REVIEW OF SYSTEMS: CONSTITUTIONAL: Positive for chills. Positive for sweats. Positive for weakness. Positive for weight gain, cannot quantify. EYES: He does follow up at [facility] and has problems with his vision. EARS, NOSE, MOUTH, AND THROAT: Decreased hearing, positive for runny nose. Positive for dry throat. CARDIOVASCULAR: Occasional chest pain and side pain. RESPIRATORY: Positive for shortness breath. Positive for cough, yellowish phlegm. GASTROINTESTINAL: No nausea. No vomiting. Positive for abdominal pain. Positive for diarrhea 2 times. No bright red blood per rectum. No melena. GENITOURINARY: Positive for burning on urination. No hematuria. MUSCULOSKELETAL: No joint pain. INTEGUMENT: He was treated for boils on the skin previously. NEUROLOGIC: No fainting or blackouts. PSYCHIATRIC: No anxiety or depression. ENDOCRINE: No thyroid problems. HEMATOLOGIC/LYMPHATIC: No anemia.   PHYSICAL EXAMINATION:  VITAL SIGNS: Temperature 95.4, pulse 86, respirations 25, blood pressure 122/87, pulse oximetry 93% on oxygen.   GENERAL: No respiratory distress.   HEENT: Eyes: Conjunctivae and lids normal. Pupils are equal, round, and reactive to light. Extraocular muscles are intact. No nystagmus. Ears, nose, mouth, and throat: Tympanic membrane blocked by wax. Nasal mucosa no erythema. Throat no erythema. No exudate seen. Lips and gums no lesions.   NECK: Positive JVD. No bruits. No lymphadenopathy. No thyromegaly. No thyroid nodules palpated.   RESPIRATORY: Decreased breath sounds bilaterally. Positive rales two-thirds of the way up the lung field.   CARDIOVASCULAR:  S1 and S2 irregularly regular, a 2/6 systolic ejection murmur. Carotid upstroke 2+ bilaterally. No bruits.   EXTREMITIES: Dorsalis pedis pulses 2+ bilaterally, 2+ edema of the lower extremity.   ABDOMEN: Soft, nontender. No organomegaly/splenomegaly. Normoactive bowel sounds. No  masses felt.   LYMPHATIC: No lymph nodes in the neck.   MUSCULOSKELETAL: 2+ edema. No clubbing. No cyanosis.   SKIN: No lesions seen.   NEUROLOGICAL: Cranial nerves II through XII are grossly intact. Deep tendon reflexes 2+ bilateral  lower extremities.   PSYCHIATRIC: The patient is oriented to person, place, and time.   LABORATORY, DIAGNOSTIC AND RADIOLOGICAL DATA: Troponin negative. White blood cell count 6.1, hemoglobin and hematocrit 12.8 and 39.2, platelet count of 185. Glucose 91, BUN 25, creatinine 2.07, sodium 142, potassium 4.8, chloride 107, CO2 26, calcium 8.5, GFR 41. Chest x-ray: Cardiomegaly, right pleural effusion. Chest x-ray shows cardiomegaly, small right pleural effusion, cannot exclude right lung base infiltrate versus atelectasis. Mild interstitial edema, correlate with congestive heart failure. BNP 25,254. EKG shows atrial fibrillation at 72 beats per minute, left axis deviation.   ASSESSMENT AND PLAN:  1. Acute respiratory failure with pulse oximetry of 93% on oxygen: Continue oxygen supplementation.  2. Acute systolic congestive heart failure with cardiomyopathy, ejection fraction 15%, and severe mitral regurgitation: Most likely this is since he was out of the Lasix. The ER physician gave 40 mg of Lasix. I will give 60 mg IV every 12 hours for right now. I will restart on his usual medications and continue to monitor from there. Continue quinapril and the Coreg.  3. Chronic kidney disease: Watch closely with diuresis.  4. Hypertension: Blood pressure is currently elevated. I will give Lasix and continue to monitor on his usual medications and then go from there.  5. Hyperlipidemia: On pravastatin.  6. Gastroesophageal reflux disease: On omeprazole.  7. Atrial fibrillation: On aspirin only for anticoagulation, Coreg for rate control.  8. We will get a Cardiology consultation since the patient's overall prognosis is poor with ejection fraction of 15%. He may be a  candidate for defibrillator placement. He is high risk for spontaneous arrhythmia and death.    TIME SPENT ON ADMISSION: 55 minutes.   ____________________________ Herschell Dimes. Renae Gloss, MD rjw:cbb D: 10/29/2011 16:34:07 ET T: 10/29/2011 17:52:05 ET JOB#: [id number]  cc: Herschell Dimes. Renae Gloss, MD, <Dictator> [facility] Salley Scarlet MD ELECTRONICALLY SIGNED 11/06/2011 21:10

## 2014-05-08 NOTE — Consult Note (Signed)
PATIENT NAME:  Drew Lynch, Drew Lynch MR#:  [id number] DATE OF BIRTH:  1960/04/29  DATE OF ADMISSION: 01/06/2012   DATE OF CONSULTATION:  01/06/2012  REFERRING PHYSICIAN:  Dr. Harriett SineNancy Phifer.  CONSULTING PHYSICIAN:  Cristoval Teall B. Afifa Truax, MD.  REASON FOR CONSULTATION: Capacity evaluation.   IDENTIFYING DATA: The patient is a 54 year old male with no past psychiatric history.   CHIEF COMPLAINT: "I was fine on Friday."   HISTORY OF PRESENT ILLNESS: The patient has no history of psychiatric illness. He returns to the hospital for his third or fourth hospitalization in the past few weeks for exacerbation of his congestive heart failure. The patient reports that he did [facility]. He was assured that he is doing well. His vital signs where stable, and returned to home. He reports that at home he started experiencing a pounding heart and sudden shortness of breath. He called for an ambulance. The patient denies any symptoms of depression, anxiety or psychosis. There are no symptoms suggestive of bipolar or mania. He denies alcohol, illicit drugs or prescription pill abuse. He does not appear cognitively declined. He was able to name all of his illnesses, is a pretty good historian. He was unable to name the newest medication for which he had high hopes. Apparently, there is no decision to be made. He wants to return to the home of his friend. Per Dr. Ernestene KielPhifer's note, the friend is okay with the patient returning into her care. He reports that he has home health nurse coming to the house who checks his vital signs. She does not provide any assistance with ADLs. The patient does not think that he was offered placement in a rehab facility.   PAST PSYCHIATRIC HISTORY: None reported. No hospitalizations. No substance abuse treatment. No suicide attempts.   FAMILY PSYCHIATRIC HISTORY: None reported.   MEDICAL HISTORY: Atrial fibrillation, severe mitral and tricuspid  regurgitation, congestive heart failure with ejection fraction of 15%, hypertension, COPD, GERD, chronic kidney disease, seizure disorder.    HOME MEDICATIONS: Advair 250/50 twice daily, Coreg 3.125 twice daily, Lasix 20 mg twice daily, K-Dur 10 mcg daily, omeprazole 20 mg twice daily, pravastatin 40 mg at bedtime, Proventil 2 puffs every 4 hours as needed, quinapril 20 mg twice daily.   MEDICATIONS AT THE TIME OF CONSULTATION: Albuterol inhaler as needed, Coreg 6.25 mg twice daily, Advair Diskus 250/50 twice daily, furosemide 60 mg IV twice daily, heparin, Levaquin 500 mg daily IV, Solu-Medrol 40 mg IV every 8 hours, Prilosec 20 mg twice daily, potassium chloride 10 mEq daily, Pravachol 40 mg at bedtime, quinapril 20 mg daily.   SOCIAL HISTORY: The patient lives with a friend, a male friend but not a girlfriend. She reportedly took him in when he was kicked out of the house by his family. She does not mind the patient returning home. He plans on going home. There is a home Geneticist, molecularhealth nurse. The patient completed the tenth grade. 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 He was never married and has no children. He has 3 brothers and 2 sisters, but they are estranged.    REVIEW OF SYSTEMS:  CONSTITUTIONAL: No fevers or chills. Positive for fatigue and some weight loss.  EYES: No double or blurred vision.  ENT: No hearing loss.  RESPIRATORY: Positive for shortness of breath.  CARDIOVASCULAR: No chest pain.  GASTROINTESTINAL: No abdominal pain, nausea, vomiting or diarrhea.  GU: No incontinence or frequency.  ENDOCRINE: No heat or  cold intolerance.  LYMPHATIC: No anemia or easy bruising.  INTEGUMENTARY: No acne or rash.  MUSCULOSKELETAL: Positive for generalized weakness.  NEUROLOGIC: No tingling or weakness.  PSYCHIATRIC: See history of present illness for details.   PHYSICAL EXAMINATION:  VITAL SIGNS: Blood pressure 122/80, pulse 80, respirations 18, temperature 97.2.  GENERAL: This is a slender  elderly gentleman in no acute distress.   The rest of the physical examination is deferred to his primary attending.   LABORATORY DATA: Chemistries: Blood glucose 107, BUN 23, creatinine 1.61. Beta-type natriuretic peptide 30,752. LFTs within normal limits. Cardiac enzymes negative. CBC within normal limits except for hemoglobin of 12.6 and hematocrit 38. Urinalysis is not suggestive of urinary tract infection. EKG: Atrial fibrillation with rapid ventricular response, with premature ventricular or aberrantly conducted complexes, left axis deviation, ST and T wave abnormality.   MENTAL STATUS EXAMINATION: The patient is alert and oriented to person, place, time and situation. He is pleasant, polite and cooperative. He is a fair historian. He is in bed, wearing a hospital gown. He is adequately groomed. There is uremic smell in the room. He maintains good eye contact. His speech is soft. Mood is fine with full affect. Thought processing is logical and goal oriented. Thought content: He denies suicidal or homicidal ideation, delusions or paranoia. There are no auditory or visual hallucinations. His cognition is grossly intact but difficult to assess today. His insight and judgment seem fair. Suicide risk assessment: This is a patient with multiple medical problems who wants to get better.   DIAGNOSES:  AXIS I: Cognitive disorder, not otherwise specified.  AXIS II: Deferred.  AXIS III: Atrial fibrillation, cardiomyopathy with ejection fraction of 15, severe mitral and tricuspid regurgitation, hypertension, chronic obstructive pulmonary disease, gastroesophageal reflux disease, chronic kidney disease, seizure disease.  AXIS IV: Physical and cognitive decline, primary support.  AXIS V: GAF 35.   PLAN: I was asked to evaluate the capacity to make decisions. Unfortunately, I could not find out from the patient or from the chart what decision is at hand. I will meet with the patient again after I talk to Dr.  Harvie Junior or care manager tomorrow. He does appear to have the capacity to make medical decisions today.      ____________________________ Ellin Goodie. Jennet Maduro, MD jbp:gb D: 01/06/2012 19:35:29 ET T: 01/07/2012 00:40:54 ET JOB#: [id number]  cc: Avanti Jetter B. Jennet Maduro, MD, <Dictator> Shari Prows MD ELECTRONICALLY SIGNED 01/08/2012 5:29

## 2014-05-08 NOTE — Discharge Summary (Signed)
PATIENT NAME:  Drew Lynch, Drew Lynch MR#:  [id number] DATE OF BIRTH:  11/30/1960  DATE OF ADMISSION:  10/29/2011 DATE OF DISCHARGE:  11/02/2011  ADDENDUM: In the discharge summary, it was accidentally transcribed as the patient is to take lovastatin. However, his discharge medication should be read as pravastatin 40 mg p.o. daily dose, to be taken at bedtime.  His discharge medication list is as follows: 1. K-Dur 10 mEq p.o. daily. 2. Omeprazole 20 mg p.o. twice daily.  3. Coreg 3.125 mg p.o. twice daily. 4. Advair Diskus 250/50 one puff twice daily. 5. Aspirin 325 mg p.o. daily.  6. Furosemide 20 mg p.o. daily.  7. Proventil HFA 2 puffs every four hours as needed.  8. Quinapril 20 mg p.o. twice daily.  9. Pravastatin 40 mg p.o. at bedtime. 10. Levofloxacin 750 mg p.o. every 48 hours.  The patient's antibiotic was supposed to be given every 48 hours for eight days from the day of discharge. ____________________________ Katharina Caperima Kathi Dohn, MD rv:slb D: 11/26/2011 14:42:00 ET T: 11/26/2011 14:55:09 ET JOB#: [id number]  cc: [facility] Katharina Caperima Tenika Keeran, MD, <Dictator> Uriel Dowding MD ELECTRONICALLY SIGNED 12/04/2011 12:21

## 2014-05-08 NOTE — H&P (Signed)
PATIENT NAME:  Drew Lynch, Drew Lynch MR#:  [id number] DATE OF BIRTH:  03/02/1960  DATE OF ADMISSION:  01/06/2012  [facility]IC[facility]  REFERRING PHYSICIAN:  Bayard Males, MD  CHIEF COMPLAINT:  Three-day history of increased shortness of breath.   HISTORY OF PRESENT ILLNESS: The patient is a 54 year old African American male with a history of severe congestive cardiomyopathy with ejection fraction of less than 25% along with moderate mitral regurgitation, history of chronic atrial fibrillation on no anticoagulation, history of systemic hypertension. The patient was admitted recently on 11/20 and discharged on 11/25 for treatment of acute respiratory failure secondary to acute on chronic congestive heart failure. The patient presented again with similar symptoms of shortness of breath. This started about 3 days ago or more, reporting exertional shortness of breath and orthopnea and with time developed some cough as well. No fever. No chills. He also noticed some swelling in his legs. The patient denies having any chest pain. No syncope. No lower extremity pain or tenderness.   REVIEW OF SYSTEMS:  CONSTITUTIONAL: Denies having any fever. No chills but developed some fatigue. No night sweats.  EYES: No blurring of vision. No double vision.  ENT: No hearing impairment. No sore throat. No dysphagia.  CARDIOVASCULAR: No chest pain but reports shortness of breath as above and leg edema. No syncope.  RESPIRATORY: Reports shortness of breath and also later developed some cough. No hemoptysis.  GASTROINTESTINAL: No abdominal pain, no vomiting, no diarrhea.  GENITOURINARY: No dysuria. No frequency of urination.  MUSCULOSKELETAL: No joint pain or swelling. No muscular pain or swelling.  INTEGUMENTARY: No skin rash. No ulcers.  NEUROLOGY: No focal weakness. No seizure activity. No headache.  PSYCHIATRY: No anxiety. No depression.  ENDOCRINE: No polyuria or polydipsia. No heat or cold  intolerance.   PAST MEDICAL HISTORY:   1.  Last admission here was November 20th, discharged November 25th, treated for acute on chronic congestive heart failure. He had an echocardiogram done at that time which revealed ejection fraction less than 25%, indicating severe cardiomyopathy. Along with that, he has moderate mitral regurgitation.  2.  Chronic atrial fibrillation.  3.  Chronic obstructive pulmonary disease. 4.  Systemic hypertension. 5.  Chronic kidney disease, stage III.  6.  Hyperlipidemia.  7.  Gout.  8.  Gastroesophageal reflux disease. 9.  There is a history of seizure disorder in the past.   PAST SURGICAL HISTORY:  Hernia repair and laser surgery to both eyes.   SOCIAL HABITS:  Nonsmoker. No history of drugs or alcohol abuse.   SOCIAL HISTORY:  He is unemployed, lives on disability.  Currently he is living with one of his friends.   FAMILY HISTORY:  Both parents are deceased. His father died from cancer. He does not know what type of cancer. His mother died from complications of pneumonia.   ADMISSION MEDICATIONS:  Quinapril 20 mg once a day, Proventil HFA 2 puffs p.r.n., pravastatin 40 mg a day, omeprazole 20 mg twice a day, Klor-Con 10 mEq once a day, furosemide 40 mg twice a day, Coreg 3.125 mg twice a day, Advair 250/50 one puff twice a day.   ALLERGIES: No known drug allergies.   PHYSICAL EXAMINATION: VITAL SIGNS: Blood pressure 142/117, respiratory rate 24, pulse 88, temperature 98.2, oxygen saturation 97%.  GENERAL APPEARANCE: This is a middle-aged male lying in bed with a mild tachypnea. Thin looking. Disheveled.  HEAD/NECK: No pallor. No icterus. No cyanosis.  ENT: Ear examination revealed normal hearing.  No ulcers. No discharge. Nasal mucosa was normal without discharge. No ulcers. Oropharyngeal examination revealed no ulcers, no oral thrush. No exudates.  EYES: Normal eyelids and conjunctiva. Pupils about 2 to 3 mm, round, equal. Could not detect reactivity to  light for the pupils were constricted.  NECK: Supple. Trachea at midline. No thyromegaly. No cervical lymphadenopathy. No masses.  HEART: Irregular S1, S2. No S3, S4. No murmur. No gallop. The apex impulse is displaced laterally of the 6th intercostal space.  RESPIRATORY: Mild tachypnea with slight use of accessory muscles. Decreased breathing sounds bilaterally, especially at the right base. Minimal rales at the left base.  ABDOMEN: Soft without tenderness. No hepatosplenomegaly. No masses. No hernias.  SKIN: No ulcers. No subcutaneous nodules.  MUSCULOSKELETAL: No joint swelling. No clubbing.  NEUROLOGIC: Cranial nerves II through XII were intact. No focal motor deficit.  PSYCHIATRY: The patient is alert, oriented to place and people. Mood and affect were normal.   LABORATORY AND DIAGNOSTIC DATA:  Chest x-ray showed significant cardiomegaly, right pleural effusion, increased pulmonary vascular congestion and also fluid in the minor fissure. His EKG showed atrial fibrillation with ventricular rate at 101 per minute. There are occasional ventricular premature complexes. Nonspecific ST-T wave abnormalities. Glucose 107. BNP was 30,000. BUN 23, creatinine 1.6, sodium 143, potassium 4.2, estimated GFR 48. Liver function tests were normal with a slightly low albumin at 3.1. Normal liver transaminases. CPK 74. Troponin 0.03. CBC showed white count of 9000, hemoglobin 12, hematocrit 38, platelet count 218.   ASSESSMENT: 1.  Acute on chronic systolic congestive heart failure with ejection fraction less than 25% by an echocardiogram done a month ago.  2.  Atrial fibrillation with rapid ventricular rate. The heart rate is variable between 100 to 120.  3.  Right pleural effusion.  4.  Chronic kidney disease, stage III.  5.  Chronic obstructive pulmonary disease. 6.  Systemic hypertension.  7.  Hyperlipidemia.  8.  Gout.   9.  Gastroesophageal reflux disease.  10.  History of seizure activity.   PLAN:  We will admit the patient to the medical floor.  I will place him on telemetry maybe the next 24 hours. IV Lasix started using 40 mg q. 6 hours x6 doses and then will shift to once a day. I will hold oral Lasix meanwhile.  Continue home medications but I will increase his Coreg from 3.125 to 6.25 mg twice a day. There is a great deal of educational issues resulting in noncompliance, especially with the dietary specifications. He understands that he has to have low salt diet; however, he does not have a clear understanding about fluid restriction. He thinks it is only about water even after I am explaining to him that he has to cut down all kinds of fluids, yet at the end he will ask me what about cranberry juice. Therefore, I think dietary consultation with a focus on education may help.   Time Spent evaluating this patient and reviewing medical records took more than one hour.     ____________________________ Carney CornersAmir M. Rudene Rearwish, MD amd:ct D: 01/06/2012 03:31:44 ET T: 01/06/2012 07:56:29 ET JOB#: [id number]  cc: Carney CornersAmir M. Rudene Rearwish, MD, <Dictator> Karolee OhsAMIR Dala DockM Valicia Rief MD ELECTRONICALLY SIGNED 01/07/2012 2:59

## 2014-05-08 NOTE — Discharge Summary (Signed)
PATIENT NAME:  Drew Lynch, Drew Lynch MR#:  [id number] DATE OF BIRTH:  [birth year]  DATE OF ADMISSION:  10/29/2011 DATE OF DISCHARGE:  10/30/2011  ADMITTING DIAGNOSIS: Congestive heart failure, left heart, acute on chronic, systolic.  DISCHARGE DIAGNOSES:  1. Congestive heart failure, acute on chronic, systolic. 2. 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  3. Suspected pneumonia in right middle lobe. 4. Respiratory distress due to congestive heart failure and pneumonia. 5. History of chronic kidney disease, hypertension, hyperlipidemia, and medical noncompliance.   DISCHARGE CONDITION: Stable.   DISCHARGE MEDICATIONS: The patient is to resume his outpatient medications which are:  1. K-Dur 10 mEq p.o. daily.  2. Omeprazole 20 mg p.o. twice daily.  3. Coreg 3.125 mg p.o. twice daily. 4. Advair Diskus 250/50, 1 puff twice daily.  5. Aspirin 325 mg p.o. daily.  6. Furosemide 20 mg p.o. daily. 7. Proventil HFA 2 puffs every four hours as needed.  8. Quinapril 20 mg p.o. twice daily.  9. Lovastatin 40 mg p.o. at bedtime. 10. Levofloxacin 750 mg p.o. every 48 hours for a total of eight days.  HOME OXYGEN: None.   DIET: 2 grams salt, low fat, low cholesterol.   ACTIVITY:  Activity limitations as tolerated.  FOLLOWUP:   1. [facility] after discharge.   2. Follow up with Dr. Gwen PoundsKowalski in the next few days after discharge.   CONSULTANTS: Dr. Lady GaryFath   RADIOLOGIC STUDIES:  1. Chest x-ray PA and lateral 10/29/2011 due to shortness of breath revealed cardiomegaly as well as small right pleural effusion, cannot exclude some right lung base infiltrate versus possible atelectasis with only vascular congestion and mild interstitial edema. Correlate for congestive heart failure versus underlying acute infectious or inflammatory process with clinical and laboratory data according to the radiologist.  2. Repeat chest x-ray PA and lateral 10/30/2011  because of concerns of pneumonia revealed  atelectasis or possibly infiltrate in the right middle lobe with some right lower lobe involvement, and a small right pleural effusion. Cardiomegaly appears present, but it is difficult to assess because of atelectasis. CT follow-up was recommended. Bronchoscopy may be necessary according to the radiologist.   HISTORY OF PRESENT ILLNESS: The patient is a 54 year old African American male with past medical history significant for history of congestive heart failure and cardiomyopathy with ejection fraction of 15% who presented to the hospital with complaints of shortness of breath. Please refer to Dr. Mathews RobinsonsWieting's admission note on 10/29/2011. On arrival to the Emergency Room, the patient's oxygenation was 93% on oxygen therapy. Vital signs otherwise were temperature 95.4, pulse 86, respirations 25, and blood pressure 122/87.  His physical exam revealed ejection murmur,  irregularly irregular. He also had 2+ lower extremity edema and diminished breath sounds and crackles at the bases in lower lung field.   The patient's laboratory data revealed elevated beta-type natriuretic peptide of  25,254. BUN and creatinine were 25 and 2.07, otherwise BMP was unremarkable. The patient's cardiac enzymes troponin showed normal troponin times three. White blood cell count was normal at 6.1, hemoglobin 12.8, platelet count 185, MCV low at 77. Coagulation panel was not done. The patient was admitted to the hospital, started on Lasix IV as well as his usual medications. It appeared that he was not taking his medications for the past one week at least.  With this therapy and Lasix IV he significantly improved. His oxygenation improved and he overall felt much better. Repeat chest x-ray, however, was concerning  for possible pneumonia and the patient was initiated on antibiotic therapy. The patient was seen by Dr. Lady Gary on 10/30/2011 who felt that the patient should continue his outpatient  medications, low fat, low sodium diet as well as daily weights. He also recommended ambulating the patient and then consider discharge home with followup with his cardiologist as well as his primary care physician at [facility].  The patient was ambulated.  His vitals were stable and he felt quite okay to be discharged home. On the day of discharge, 10/30/2011, the patient's temperature was 97.4, pulse 68, respiratory rate 20, blood pressure 111/87, saturation was 92% on room air at rest and 98% on room air on exertion. It was felt that the patient should continue his medications for his congestive heart failure, his usual outpatient medications, plus he is to continue Levaquin antibiotic therapy for presumed right middle and possibly right lower lobe pneumonia. He is to follow up with his [facility] primary care physician and make decisions about  repeating his chest x-ray as well as possibly even a CT scan of his chest and have a pulmonary consultation if his chest x-ray does not improve. In regards to congestive heart failure,  he is to continue his congestive heart failure medications and follow up with Dr. Arnoldo Hooker for management recommendations.   TIME SPENT: 40 minutes.    ____________________________ Katharina Caper, MD rv:bjt D: 11/02/2011 14:09:07 ET T: 11/02/2011 16:06:59 ET JOB#: [id number]  cc: Katharina Caper, MD, <Dictator> [facility] Lamar Blinks, MD Jensen Kilburg Winona Legato MD ELECTRONICALLY SIGNED 11/21/2011 7:57

## 2014-05-08 NOTE — Consult Note (Signed)
Brief Consult Note: Diagnosis: Cognitive disorder NOS.   Patient was seen by consultant.   Consult note dictated.   Recommend further assessment or treatment.   Comments: Mr. Drew Lynch has multiple medical problems but no psychiatric history. I was asked to evaluate the capacity to make medical decisions. I am not certain what decision he has to make.   PLAN: 1. I will return tomorrow to discuss his case with care manager.   2. There may be some cognitive declone but the patient does not appear to have clear deficits.   3. Poor medication complaince is worrisome and may be the indicator of cognitive problems.   4. I will follow up.  Electronic Signatures: Kristine LineaPucilowska, Banesa Tristan (MD)  (Signed 18-Dec-13 19:43)  Authored: Brief Consult Note   Last Updated: 18-Dec-13 19:43 by Kristine LineaPucilowska, Kanija Remmel (MD)

## 2014-05-08 NOTE — H&P (Signed)
PATIENT NAME:  Drew Lynch, Drew Lynch MR#:  [id number] DATE OF BIRTH:  08-Sep-1960  DATE OF ADMISSION:  09/10/2011  PR[facility] CARE PHYSICIAN: [facility]    CHIEF COMPLAINT: Shortness of breath.   HISTORY OF PRESENT ILLNESS: This is a 54 year old male who presents to the Emergency Room due to shortness of breath ongoing for the past two days. Patient also says that he has had about a 13 pound weight gain in the past month or so. He also complains of paroxysmal nocturnal dyspnea and exertional dyspnea too. He admits to a cough which is productive with clear sputum but no nausea, vomiting, no chest pain, no abdominal pain and no other associated symptoms. Patient presented to the ER, was noted to be slightly hypoxic and noted to be in mild congestive heart failure. Hospitalist services were contacted for further treatment and evaluation.   REVIEW OF SYSTEMS: CONSTITUTIONAL: No documented fever. Positive weight gain of 13 pounds. No weight loss. EYES: No blurred or double vision. ENT: No tinnitus. No postnasal drip. No redness the oropharynx. RESPIRATORY: Positive cough. Positive wheeze. Positive dyspnea on exertion. Positive orthopnea. CARDIOVASCULAR: No chest pain. Positive orthopnea. No palpitations, no syncope. GASTROINTESTINAL: No nausea, no vomiting, no diarrhea, no abdominal pain, no melena, no hematochezia. GENITOURINARY: No dysuria, no hematuria. ENDOCRINE: No polyuria or nocturia. No heat or cold intolerance. HEME: No anemia. No bruising. No bleeding. INTEGUMENTARY: No rashes. No lesions. MUSCULOSKELETAL: No arthritis, no swelling, no gout. NEUROLOGIC: No numbness, no tingling, no ataxia, no seizure-type activity. PSYCH: No anxiety, no insomnia, no ADD.   PAST MEDICAL HISTORY:  1. Chronic atrial fibrillation.  2. History of cardiomyopathy, ejection fraction of 20%.  3. Hypertension.  4. Chronic obstructive pulmonary disease.  5. Gastroesophageal reflux disease.  6. Chronic kidney disease,  stage III.   ALLERGIES: No known drug allergies.   SOCIAL HISTORY: Used to be a smoker, quit many years ago. Occasional alcohol use. No illicit drug use.   FAMILY HISTORY: Patient does have a family history of diabetes on the mother's side of the family and also history of cancer on the father's side of the family.   CURRENT MEDICATIONS:  1. Advair 250/50, 1 puff b.i.d.  2. Albuterol inhaler 2 puffs every 4-6 hours as needed.  3. Aspirin 325 mg daily.  4. Coreg 3.125 mg b.i.d.  5. Lasix 40 mg b.i.d.  6. Potassium 10 mEq daily.  7. Lipitor 20 mg daily.  8. Omeprazole 20 mg daily.  9. Quinapril 20 mg 2 tabs daily.  10. Tylenol as needed.   PHYSICAL EXAMINATION:  VITAL SIGNS: Patient's vital signs are noted to be: Temperature 97.2, pulse 80, respirations 22, blood pressure 132/94, sats 95% on 2 liters nasal cannula.   GENERAL: Patient is a pleasant appearing male in no apparent distress.   HEENT: Atraumatic, normocephalic. Extraocular muscles are intact. Pupils equal, reactive to light. Sclerae anicteric. No conjunctival injection. No pharyngeal erythema.   NECK: Supple. There is no jugular venous distention, no bruits, no lymphadenopathy, no thyromegaly.   HEART: Irregular. No murmurs, no rubs, no clicks.   LUNGS: He has some positive end expiratory wheezing. Negative use of accessory muscles. No dullness to percussion.   ABDOMEN: Soft, flat, nontender, nondistended. Has good bowel sounds. No hepatosplenomegaly appreciated.   EXTREMITIES: No evidence of any cyanosis, clubbing, or peripheral edema. Has +2 pedal and radial pulses bilaterally.   NEUROLOGICAL: Patient is alert, awake, oriented x3 with no focal motor or sensory deficits appreciated bilaterally.  SKIN: Moist, warm with no rash appreciated.   LYMPHATIC: There is no cervical or axillary lymphadenopathy.   LABORATORY, DIAGNOSTIC AND RADIOLOGICAL DATA: Serum glucose 115, BUN 23, creatinine 1.86, sodium 141, potassium  3.6, chloride 108, bicarbonate 26. BNP is elevated at 15,096. Troponin 0.03, white cell count 5.9, hemoglobin 12.4, hematocrit 37.8, platelet count 174.   Patient's urinalysis is within normal limits. INR 1.3.   EKG does show sign of atrial flutter.   ASSESSMENT AND PLAN: This is a 54 year old male with a history of chronic atrial fibrillation/flutter, chronic kidney disease, stage III, history of cardiomyopathy, ejection fraction of 20%, chronic obstructive pulmonary disease came into the hospital with shortness of breath.  1. Acute on chronic congestive heart failure. This is likely acute on chronic systolic congestive heart failure given his cardiomyopathy and LV dysfunction. I will go ahead and gently diurese him with IV Lasix. Follow ins and outs and daily weights. I will continue his Coreg for now. Hold ACE inhibitor given worsening renal failure. Also cycle his cardiac markers to rule out ischemia.  2. Acute on chronic renal failure. Patient has underlying CKD, stage III, baseline creatinine 1.7, currently elevated at 1.8. I will watch his renal function closely with diuresis, renal dose medications, avoid nephrotoxins, hold ACE inhibitor for now.  3. Chronic obstructive pulmonary disease. Mild chronic obstructive pulmonary disease exacerbation. I will continue his and Advair for now. Will add some Spiriva. Continue p.r.n. nebulizer treatments.  4. Gastroesophageal reflux disease. Continue omeprazole.  5. Chronic atrial fibrillation/flutter. Continue Coreg for rate control. Patient is on aspirin, not a candidate for Coumadin due to noncompliance.  6. CODE STATUS: Patient is a FULL CODE.    TIME SPENT WITH ADMISSION: 50 minutes.   ____________________________ Rolly PancakeVivek J. Cherlynn KaiserSainani, MD vjs:cms D: 09/10/2011 20:57:01 ET T: 09/11/2011 06:10:56 ET JOB#: [id number]  cc: Rolly PancakeVivek J. Cherlynn KaiserSainani, MD, <Dictator> [facility]  [place] SirenVIVEK J SAINANI MD ELECTRONICALLY SIGNED 09/16/2011  22:33

## 2014-05-08 NOTE — Discharge Summary (Signed)
PATIENT NAME:  Drew Lynch, Drew Lynch MR#:  [id number] DATE OF BIRTH:  [birth year]  DATE OF ADMISSION:  09/10/2011 DATE OF DISCHARGE:  09/12/2011  DIAGNOSES:  1. Acute on chronic systolic congestive heart failure exacerbation. 2. Acute on chronic renal disease. 3. Chronic obstructive pulmonary disease. 4. Gastroesophageal reflux disease. 5. Chronic atrial fibrillation, atrial flutter.   DISPOSITION: The patient is being discharged home.   FOLLOW-UP:  1. Follow-up with PCP, Dr. [name], in 1 to 2 weeks after discharge.  2. Follow-up with nephrologist, Dr. Cherylann RatelLateef, in 1 to 2 weeks after discharge.   DIET: Low sodium.   ACTIVITY: As tolerated.   DISCHARGE MEDICATIONS:  1. Aspirin 325 mg daily.  2. Lasix 20 mg daily.  3. Spiriva 18 mcg inhaled daily.  4. Levaquin 250 mg daily for five days.  5. Albuterol 1 to 2 puffs q.4 to 6 hours p.r.n. shortness of breath.  6. Tylenol 325 mg 1 to 2 tablets q.4 to 6 hours p.r.n.  7. Lipitor 20 mg daily.  8. Advair 250/50 one puff b.i.d.  9. Coreg 3.125 mg b.i.d.  10. Omeprazole 20 mg b.i.d.  11. K-Dur 10 mEq daily.  12. ProAir HFA 2 puffs four times a day.   CONSULTATIONS: Nephrology consultation with Dr. Wynelle LinkKolluru and Dr. Cherylann RatelLateef   LABORATORY, DIAGNOSTIC, AND RADIOLOGICAL DATA: Bilateral kidney ultrasound showed no evidence of obstruction. There is bilateral renal atrophy and the echotexture of the kidneys is consistent with medical renal disease. Bilateral pleural effusions. Ureterocele on the left.   Chest x-ray showed CHF and pulmonary interstitial edema.   EKG atrial fibrillation/atrial flutter.   Rapid HIV was negative. Vitamin D normal. PTH elevated. Urinalysis showed no evidence of infection. White count normal. Hemoglobin 12.8. Normal platelet count. BNP [id number]. Glucose 115, BUN 23, creatinine 1.72 to 2.01. Electrolytes. Normal. Cardiac enzymes normal.   HOSPITAL COURSE: The patient is a 54 year old male with past medical history of  chronic atrial fibrillation/atrial flutter not on any anticoagulation due to history of noncompliance and chronic kidney disease stage III, and cardiomyopathy with EF of 20% who presented with shortness of breath. He was admitted with a diagnosis of acute on chronic systolic CHF exacerbation and diuresed with Lasix which resulted in acute on chronic kidney failure. Therefore, the dose of his Lasix was reduced. His ACE inhibitor was discontinued. With reduction of dose of Lasix, his renal function has stabilized and he is back to his baseline creatinine of 1.7. He follows up with Dr. Cherylann RatelLateef as an outpatient. During the hospitalization he had good urine output with Lasix. He also had mild COPD exacerbation and was treated with nebulizers, inhalers, and antibiotics. He is not on any anticoagulation due to noncompliance. With diuresis the patient's presenting symptoms resolved. He was on room air ambulating without difficulty and is being discharged home in stable condition.   TIME SPENT: 45 minutes.   ____________________________ Darrick MeigsSangeeta Kimi Bordeau, MD sp:drc D: 09/12/2011 14:21:14 ET T: 09/12/2011 14:33:49 ET JOB#: [id number]  cc: Darrick MeigsSangeeta Darrielle Pflieger, MD, <Dictator> Sarah "Sallie" Allena KatzPatel, MD Lennox PippinsMunsoor N. Lateef, MD Darrick MeigsSANGEETA Linwood Gullikson MD ELECTRONICALLY SIGNED 09/13/2011 8:45

## 2014-05-08 NOTE — Discharge Summary (Signed)
PATIENT NAME:  Drew Lynch, Drew Lynch MR#:  [id number] DATE OF BIRTH:  14-Aug-1960  DATE OF ADMISSION:  11/25/2011 DATE OF DISCHARGE:  11/29/2011  For a detailed note, please take a look at the history and physical done on admission by Dr. Auburn Bilberry.   DIAGNOSES AT DISCHARGE:  1. Acute respiratory failure likely secondary to combination of congestive heart failure and chronic obstructive pulmonary disease.  2. Acute on chronic systolic congestive heart failure.  3. Chronic obstructive pulmonary disease exacerbation.  4. Cardiomyopathy with EF of 15%. 5. Chronic kidney disease, stage III. 6. Hyperlipidemia.   PROCEDURES DONE DURING THE HOSPITAL COURSE: Ultrasound-guided thoracentesis.   DIET: The patient is being discharged on a low sodium, low fat diet.   ACTIVITY: As tolerated.   FOLLOW-UP:  1. Follow-up at [facility] in the next 1 to 2 weeks. 2. Follow-up with Dr. Mady Haagensen or Dr. Mosetta Pigeon the next 1 to 2 weeks.   DISCHARGE MEDICATIONS:   1. Potassium 10 mEq daily.  2. Omeprazole 20 mg b.i.d.  3. Coreg 3.125 mg b.i.d.  4. Advair 250/50 1 puff b.i.d.  5. Albuterol inhaler 2 puffs q.4 hours as needed.  6. Pravachol 40 mg daily.  7. Quinapril 20 mg daily.  8. Lasix 40 mg b.i.d.  9. Prednisone taper starting at 50 mg down by 10 mg over the next five days.   CONSULTANTS DURING THE HOSPITAL COURSE:  1. Dr. Mosetta Pigeon from Nephrology  2. Dr. Arnoldo Hooker from Cardiology    PERTINENT STUDIES DONE:  1. Chest x-ray done on November 6th showing stable cardiomegaly with interstitial infiltrate and increasing right-sided pleural effusion.  2. Ultrasound-guided thoracentesis done on the right with 1 liter of straw-colored fluid removed.   HOSPITAL COURSE: This is a 54 year old male with medical problems as mentioned who presented to the hospital with acute onset of shortness of breath and noted to be in congestive heart failure.  1. Acute respiratory failure. This was  likely multifactorial in nature related to his underlying CHF combined with some mild COPD exacerbation. He did present with some fevers and noted to have a right-sided pleural effusion, therefore, there was initially a concern for empyema. He was started on empiric antibiotics with ceftriaxone and Zithromax and also started on IV diuretics. The patient also underwent a right-sided ultrasound-guided thoracentesis with 1 liter of straw-colored fluid removed. The fluid was considered transudative in nature. His white cell count remained stable and he remained afebrile, therefore, he was taken off antibiotics. The patient has diuresed well with IV Lasix and lost about 10 pounds of water weight since being admitted and clinically feels more stable. He was ambulated on room air, did not require any oxygen, and did not desat and, therefore, was discharged home on his maintenance meds as stated.  2. Acute congestive heart failure. This was acute on chronic systolic dysfunction. The patient has severe cardiomyopathy, EF of 15%. As mentioned, he was diuresed well with IV Lasix, lost about 10 pounds of water weight. His Lasix dose is being increased upon discharge from 20 mg b.i.d. to 40 mg b.i.d. He will continue his other maintenance meds as stated.  3. COPD exacerbation. This is likely secondary to ongoing tobacco abuse. The patient had some fever when he presented to the hospital, therefore, there was some concern that his pleural effusion was possible empyema related. He was empirically started on antibiotics, also started on IV steroids, and around-the-clock nebulizer treatments although since he has remained afebrile  and his white cell count remained stable he was taken off antibiotics. His pleural fluid was consistent to be more transudative in nature than exudative. He was, therefore, discharged on a prednisone taper as mentioned.  4. Chronic kidney disease, stage III. The patient presented to the hospital with a  creatinine of 2.5. After aggressive diuresis, his creatinine went down to as low as 1.6. He does have chronic kidney disease as mentioned. He will continue to follow-up with Dr. Cherylann RatelLateef and Dr. Thedore MinsSingh as an outpatient.  5. Hyperlipidemia. The patient was maintained on Pravachol. He will resume that.  6. Gastroesophageal reflux disease. The patient was maintained on his omeprazole and he will resume that upon discharge too.  7. Chronic atrial fibrillation with cardiomyopathy, EF of 15%. The patient remained rate controlled with his atrial fibrillation. He will continue his Coreg as stated. He is not on any anticoagulation given the fact that he is not compliant and has not followed up with his doctor as an outpatient. When he shows signs of following up with his doctors, he can then be initiated on anticoagulation for his atrial fibrillation.   CODE STATUS: The patient is a FULL CODE.   TIME SPENT: 40 minutes.   ____________________________ Rolly PancakeVivek J. Cherlynn KaiserSainani, MD vjs:drc D: 11/30/2011 08:28:51 ET T: 11/30/2011 12:49:53 ET JOB#: [id number]  cc: Rolly PancakeVivek J. Cherlynn KaiserSainani, MD, <Dictator> [facility] Munsoor Lizabeth LeydenN. Lateef, MD Mosetta PigeonHarmeet Singh, MD [place] SirenVIVEK J Berna Gitto MD ELECTRONICALLY SIGNED 12/03/2011 8:11

## 2014-05-08 NOTE — Consult Note (Signed)
General Aspect [name] with cardiomyopathy with ejection fraction of 15%, history of chronic kidney disease grade 3 who was noted to be dyspneic by his nephrologist and referred to the emergency room for evaluation. He was noted to be mildly volume overloaded and mildly hypoxic on presentation in the emergency room. It is of note that the patient had been noncompliant with his medications for approximately one week do to not being able to get [facility]. He was placed back on his home medications and has dramatically improved. He is currently at his baseline he denies shortness of breath, chest pain. He states he is compliant with his diet and his medications for the most part but had difficulty obtaining them recently.   Physical Exam:   GEN thin    HEENT PERRL, hearing intact to voice    NECK supple    RESP normal resp effort  no use of accessory muscles  rhonchi    CARD Regular rate and rhythm  Normal, S1, S2  Murmur    Murmur Systolic    Systolic Murmur axilla    ABD denies tenderness  normal BS    LYMPH negative neck, negative axillae    EXTR negative cyanosis/clubbing, negative edema    SKIN normal to palpation    NEURO cranial nerves intact, motor/sensory function intact    PSYCH A+O to time, place, person   Review of Systems:   Subjective/Chief Complaint shortness of breath    General: Fatigue    Skin: No Complaints    ENT: No Complaints    Eyes: No Complaints    Neck: No Complaints    Respiratory: Short of breath    Cardiovascular: Dyspnea    Gastrointestinal: No Complaints    Genitourinary: No Complaints    Vascular: No Complaints    Musculoskeletal: No Complaints    Neurologic: No Complaints    Hematologic: No Complaints    Endocrine: No Complaints    Psychiatric: No Complaints    Review of Systems: All other systems were reviewed and found to be negative    Medications/Allergies  Reviewed Medications/Allergies reviewed        Admit Diagnosis:   ACUTE RESPIRATORY FAILURE: 30-Oct-2011, Active, ACUTE RESPIRATORY FAILURE      Admit Reason:   Acute respiratory failure: (518.81) Active, ICD9, Acute respiratory failure  Home Medications: Medication Instructions Status  levofloxacin 750 mg oral tablet 1 tab(s) orally every 48 hours x 8 days Active  furosemide 20 mg oral tablet 1 tab(s) orally once a day Active  K-Dur 10 oral tablet, extended release 1 tab(s) orally once a day Active  omeprazole 20 mg oral delayed release capsule 1 cap(s) orally 2 times a day Active  Coreg 3.125 mg oral tablet 1 tab(s) orally 2 times a day Active  Advair Diskus 250 mcg-50 mcg inhalation powder 1 puff(s) inhaled 2 times a day Active  aspirin 325 mg oral tablet 1 tab(s) orally once a day Active  Proventil HFA 90 mcg/inh inhalation aerosol 2 puff(s) inhaled every 4 hours, As Needed- for Shortness of Breath  Active  quinapril 20 mg oral tablet 1 tab(s) orally 2 times a day Active  pravastatin 40 mg oral tablet 1 tab(s) orally once a day (at bedtime) Active   EKG:   EKG NSR    Abnormal NSSTTW changes   Radiology Results: XRay:    10-Oct-13 12:48, Chest PA and Lateral   Chest PA and Lateral  REASON FOR EXAM:    SOB  COMMENTS:       PROCEDURE: DXR - DXR CHEST PA (OR AP) AND LATERAL  - Oct 29 2011 12:48PM     RESULT: Comparison is made to an image of 10 September 2011.    The cardiac silhouette is enlarged. There is elevation of the right   hemidiaphragm versus right pleural effusion with right lung base   infiltrate that appears to be predominantly in the right lower lobe.   Pulmonary vascular congestion and possibly some mild interstitial edema   is present. No pneumothorax is evident.    IMPRESSION:   1. Cardiomegaly.  2. Small right pleural effusion.  3. Cannot exclude some right lung base infiltrate versus possibly   atelectasis with only vascular congestion and mild  interstitial edema.   Correlate for congestive heart failure versus underlying acute infectious   or inflammatory process with clinical and laboratory data.    Dictation Site: 2          Verified By: Elveria Royals, M.D., MD    No Known Allergies:     Impression 54 year old African American male with history of cardiomyopathy with an ejection fraction of 15-20%, history of chronic kidney disease who was admitted after presenting to the emergency room with shortness of breath. He was noted to have mild volume overload on chest x-ray. He had been noncompliant with his medications for approximately one week especially his diuretic. He was restarted on his diuretics and had dramatic improvement in his symptoms back to baseline. He had an elevated BNP but his serum troponin was unremarkable. He denies chest pain. Etiology of his decompensation appears to be noncompliance with medications. He understands the importance of being on his medications.    Plan 1. Continue current medications 2. Low-fat, low-sodium diet 3. Daily weights 4. Given the patient's ability back on his medications, would ambulate and consider discharge 5. Followup as an outpatient with Dr. Arnoldo Hooker, patient's primary cardiologist as well as at the [facility].   Electronic Signatures: Dalia Heading (MD)  (Signed 11-Oct-13 21:50)  Authored: General Aspect/Present Illness, History and Physical Exam, Review of System, Health Issues, Home Medications, EKG , Radiology, Allergies, Impression/Plan   Last Updated: 11-Oct-13 21:50 by Dalia Heading (MD)

## 2014-05-08 NOTE — Discharge Summary (Signed)
PATIENT NAME:  Drew Lynch, Drew Lynch MR#:  [id number] DATE OF BIRTH:  January 28, 1960  DATE OF ADMISSION:  01/06/2012 DATE OF DISCHARGE:  01/09/2012  ADMITTING PHYSICIAN: Marlaine Hind.  DISCHARGING PHYSICIAN: Enid Baas.   PRIMARY CARE DOCTOR:  Hillery Aldo.  CONSULTATIONS IN THE HOSPITAL: Cardiology consultation with Dr. Lady Gary and psych consultation by Dr. Jennet Maduro  DISCHARGE DIAGNOSES: 1.  Acute on chronic systolic congestive heart failure exacerbation.  2.  Systolic congestive heart failure with ejection fraction less than 25%. 3.  Recurrent admissions for congestive heart failure.  4.  Noncompliant with medications and low sodium diet.  5.  Chronic obstructive pulmonary disease exacerbation with bronchitis.  6.  Chronic kidney disease. 7.  Hypertension.  8.  Mild cognitive decline.   9.  Chronic atrial fibrillation, not a candidate for anticoagulation due to poor compliance.    DISCHARGE MEDICATIONS:  1.  Quinapril 20 mg p.o. daily.  2.  Pravachol 40 mg p.o. daily.  3.  Advair 250/50, 1 puff b.i.d.  4.  Klor-Con 10 mEq p.o. daily.  5.  Prilosec 20 mg p.o. b.i.d.  6.  Proventil 2 puffs q.4 hours p.r.n. for shortness of breath.  7.  Coreg 6.25 mg p.o. b.i.d.  8.  Lasix 60 mg p.o. b.i.d.  9.  Diltiazem 120 mg p.o. daily.  10.  Levaquin 500 mg p.o. daily for 4 days.  11.  Prednisone 60 mg p.o. daily and tapered by10 mg every day.  12.  Home oxygen 3 liters.   DISCHARGE ACTIVITY: As tolerated.    DISCHARGE DIET: Low sodium diet.    FOLLOWUP INSTRUCTIONS: 1.  Please consume a low sodium diet, restrict sodium to less than 2 grams per day  2.  Home health nursing.  3.  Follow up with Dr. Gwen Pounds in 1 to 2 weeks.  4.  PCP followup in 2 weeks.   LABS AND IMAGING STUDIES:  1.  Sodium 138, potassium 3.9, chloride 101, bicarbonate 30, BUN 48, creatinine 1.59, glucose 143, calcium 8.9.  2.  Urinalysis negative for any infection.  3.  Sputum cultures are negative.  4.  Troponin  is negative.  5.  WBC 9.7, hemoglobin 12.6, hematocrit 38.0, platelet count 218.  6.  Chest x-ray on admission showing pulmonary vascular congestion and right pleural effusion with basilar atelectasis worsened since last admission.   BRIEF HOSPITAL COURSE: The patient is a 54 year old African American male with known history of systolic CHF, EF less than 25%, hypertension, mitral regurgitation, chronic A. Fib. not on anticoagulation due to poor compliance, presents to the hospital secondary to worsening shortness of breath. This is his fourth admission in the recent months and has had recurrent admissions due to CHF exacerbation.  1.  Acute on chronic systolic CHF exacerbation with likely poor compliance to diet or uncontrolled atrial fibrillation though rate is well controlled in the hospital. He has been diuresed vigorously in the hospital and is being discharged on a higher dose of Lasix at 60 mg b.i.d.  He is also hypoxic on exertion so he is being sent on home oxygen 3 liters at this time. All his other medications were continued. He is on Coreg, the dose has been increased, and Cardizem has been added for better heart rate control to prevent further CHF.  He does have CKD but he is on quinapril and creatinine has been steady while in the hospital.   2.  COPD with COPD exacerbation with bronchitis. He is on prednisone taper and Levaquin  for 4 more days. His cough is improved and breathing is improved at this time.  3.  CKD at baseline. He is on quinapril and increased dose of Lasix, to follow up as an outpatient.  4.  Hypertension. His other home medications are being continued.  5.  Chronic atrial fibrillation. Rate was well controlled, again not a good candidate for anticoagulation due to compliance issues.   CODE STATUS: Full code.   DISCHARGE DISPOSITION: Home with home health.   TIME SPENT ON DISCHARGE: 45 minutes.   DISCHARGE CONDITION: Stable.    ____________________________ Enid Baasadhika  Ege Muckey, MD rk:cs D: 01/09/2012 12:26:00 ET T: 01/10/2012 19:27:05 ET JOB#: [id number]  cc: Enid Baasadhika Kacin Dancy, MD, <Dictator> Enid BaasADHIKA Jedidiah Demartini MD ELECTRONICALLY SIGNED 01/13/2012 10:56

## 2014-05-08 NOTE — Discharge Summary (Signed)
PATIENT NAME:  Drew Lynch, Drew Lynch MR#:  [id number] DATE OF BIRTH:  1960/03/27  DATE OF ADMISSION:  12/09/2011 DATE OF DISCHARGE:  12/14/2011  For a detailed note, please take a look at the history and physical done on admission by Dr. Jacques Navy.   DIAGNOSES AT DISCHARGE:  1. Acute on chronic congestive heart failure likely secondary to systolic dysfunction.  2. Shortness of breath secondary to congestive heart failure.  3. Chronic kidney disease, stage III. 4. Chronic obstructive pulmonary disease. 5. Gout.  6. Hypertension.  7. Hyperlipidemia.   DIET: The patient is being discharged on a low sodium, low fat diet.   ACTIVITY: As tolerated.   FOLLOW-UP: Follow-up in the next 1 to 2 weeks with Dr. Hillery Aldo at [facility]    DISCHARGE MEDICATIONS: 1. Omeprazole 20 mg b.i.d.  2. Coreg 3.125 mg.   3. Advair 250/50 1 puff b.i.d.  4. Albuterol inhaler 2 puffs q.4 hours as needed.  5. Pravachol 40 mg daily.  6. Quinapril 20 mg daily.  7. Lasix 40 mg b.i.d.  8. Potassium 10 mEq daily.  9. Prednisone taper starting at 50 mg, down to 10 mg over the next five days.   CONSULTANTS DURING THE HOSPITAL COURSE:  1. Dr. Harold Hedge from Cardiology  2. Dr. Erin Fulling from Pulmonary Critical Care    PERTINENT STUDIES DONE DURING THE HOSPITAL COURSE:  1. Chest x-ray on admission showed improved aeration of the right hemithorax with decreasing pleural effusion, stable cardiomegaly. 2. V/Q scan abnormal with intermediate probability for pulmonary embolism. 3. Doppler of his lower extremities showed no evidence of any DVT.  4. Echocardiogram done showing left ventricular systolic function to be elevated at 50 to 60 mmHg. Moderate tricuspid regurgitation. Severely reduced LV systolic function with EF less than 25%. Moderate mitral regurgitation. No pericardial effusion. Left atrium mildly dilated. Trace aortic regurgitation.   HOSPITAL COURSE: This is a 54 year old male with multiple  medical problems as mentioned above who presented to the hospital secondary to shortness of breath and acute respiratory failure likely secondary to acute on chronic congestive heart failure.  1. Acute respiratory failure. This is likely acute on chronic respiratory failure secondary to his decompensated CHF. The patient was admitted to the hospital and started on aggressive IV diuresis with Lasix. He responded well to diuresis and is about 10 liters negative since admission. His oxygen sats even on room air are in the high 90's and even on ambulation are in the mid 90's. He clinically feels much better and, therefore, is being discharged home.  2. Acute on chronic decompensated congestive heart failure. This is likely secondary to chronic systolic dysfunction. He has a severe cardiomyopathy, ejection fraction of less than 25%. The patient apparently was noncompliant with his medications as he had not gotten his Lasix filled shortly after his previous discharge. He did not have any insight into his disease process. Therefore, he is currently being discharged home with home health services. He was aggressively diuresed with IV Lasix. His clinical symptoms have improved. He has diuresed about 10 liters since admission. Shortness of breath significantly improved. He did not desat on ambulation. He currently is being discharged on his Coreg and quinapril along with Lasix and potassium supplements as stated.  3. COPD. He had no evidence of any acute exacerbation. He will continue his Advair and Proventil inhaler as stated.  4. Hyperlipidemia. The patient was maintained on his Pravachol. He will resume that.  5. Hypokalemia. This was  likely secondary to his aggressive diuresis. He currently is being discharged on potassium supplements. Since he has chronic kidney disease, his potassium should be followed closely as an outpatient.  6. Chronic kidney disease, stage III. The patient follows up with Dr. Cherylann RatelLateef and Dr.  Thedore MinsSingh. He will continue follow-up with them as an outpatient. His creatinine did not have any further increase.  7. Acute gout. The patient complained of right foot pain on the day of discharge, although his leg did not appear to be that red or swollen. I could not give him any indomethacin or colchicine given his renal failure. Therefore, I am discharging him on a prednisone taper.   CODE STATUS: The patient is a FULL CODE.   DISPOSITION: He is being discharged home with home health.   TIME SPENT: 40 minutes.   ____________________________ Rolly PancakeVivek J. Cherlynn KaiserSainani, MD vjs:drc D: 12/14/2011 15:49:50 ET T: 12/15/2011 12:22:25 ET JOB#: [id number]  cc: Rolly PancakeVivek J. Cherlynn KaiserSainani, MD, <Dictator> Sarah "Sallie" Allena KatzPatel, MD Houston SirenVIVEK J SAINANI MD ELECTRONICALLY SIGNED 12/18/2011 14:57

## 2014-05-08 NOTE — Consult Note (Signed)
    General Aspect Pt is a 54 yo male with history of severe dilated cardiomyopathy with ef of 20-25%, history of atrial fibrilation, chronic renal insuffiency and history of poor medical compliance who was admitted with progressive shortness of breath. He states he is taking most of his medicaitons.  CXR reveals volume overload. He has ruled out for an mi. He has improved with diuresis.   Physical Exam:   GEN well nourished, no acute distress    HEENT PERRL, hearing intact to voice    NECK supple    RESP normal resp effort  no use of accessory muscles  rhonchi    CARD Irregular rate and rhythm  Murmur    Murmur Systolic    Systolic Murmur axilla    ABD denies tenderness  no hernia  normal BS  no Abdominal Bruits    LYMPH negative neck, negative axillae    EXTR negative cyanosis/clubbing, positive edema    SKIN normal to palpation    NEURO cranial nerves intact, motor/sensory function intact    PSYCH alert, poor insight   Review of Systems:   Subjective/Chief Complaint shortness of breath    General: Fatigue  Weakness    Skin: No Complaints    ENT: No Complaints    Eyes: No Complaints    Neck: No Complaints    Respiratory: Short of breath    Cardiovascular: Dyspnea  Orthopnea    Gastrointestinal: No Complaints    Genitourinary: No Complaints    Vascular: No Complaints    Musculoskeletal: No Complaints    Neurologic: No Complaints    Hematologic: No Complaints    Endocrine: No Complaints    Psychiatric: No Complaints    Review of Systems: All other systems were reviewed and found to be negative    Medications/Allergies Reviewed Medications/Allergies reviewed   EKG:   Interpretation arial fibrillation with variable ventricular response.    No Known Allergies:     Impression 54 yo male with history of dilated cardiomyopathy with ef of 25-30% who has had frequent admissions for chf. He has been intermitantly compliant with his medicaitons. CXR on  this admission reveals at least moderate chf. His bnp is elevated. Some evidene of possible bronchitis on cxr as well. Does not appear to have had an acute ischemic event.l His ekg reveals his chornic afib which is fairly well controlled rate whise. Has stressed importance of compliance with meds and low sodium diet.    Plan 1. Continue with current meds including carvedilol, ace i and diuresis following renal function 2. Agree with emperic antibiotics. 3. Would not proceed with invasive evalaution at present as he has not ruled in for mi.  4  Not an ideal cnadidate for chronic anticoagulation due to compliance issues. 5. Cointinue with asa. 6. Discussed importance of low sodum diet.   Electronic Signatures: Dalia HeadingFath, Kenneth A (MD)  (Signed 27-Dec-13 13:42)  Authored: General Aspect/Present Illness, History and Physical Exam, Review of System, EKG , Allergies, Impression/Plan   Last Updated: 27-Dec-13 13:42 by Dalia HeadingFath, Kenneth A (MD)

## 2014-05-08 NOTE — Consult Note (Signed)
General Aspect Pt is a 54 yo male with history of severe dilated cardiomyopathy with ef of 20-25%, history of atrial fibrilation, chronic renal insuffiency and history of poor medical compliance who was admitted with progressive shortness of breath. He states he is taking most of his medicaitons. Complains of cough. Has been treated with evidence based medicaitons incoluding ace i and carvedilol. CXR reveals volume overload. He has ruled out for an mi. He has improved with emperic antibiotics and diuresis.   Physical Exam:   GEN well nourished, no acute distress    HEENT PERRL, hearing intact to voice    NECK supple    RESP normal resp effort  no use of accessory muscles  rhonchi    CARD Irregular rate and rhythm  Murmur    Murmur Systolic    Systolic Murmur axilla    ABD denies tenderness  no hernia  normal BS  no Abdominal Bruits    LYMPH negative neck, negative axillae    EXTR negative cyanosis/clubbing, positive edema    SKIN normal to palpation    NEURO cranial nerves intact, motor/sensory function intact    PSYCH alert, poor insight   Review of Systems:   Subjective/Chief Complaint shortness of breath    General: Fatigue  Weakness    Skin: No Complaints    ENT: No Complaints    Eyes: No Complaints    Neck: No Complaints    Respiratory: Short of breath    Cardiovascular: Dyspnea  Orthopnea    Gastrointestinal: No Complaints    Genitourinary: No Complaints    Vascular: No Complaints    Musculoskeletal: No Complaints    Neurologic: No Complaints    Hematologic: No Complaints    Endocrine: No Complaints    Psychiatric: No Complaints    Review of Systems: All other systems were reviewed and found to be negative    Medications/Allergies Reviewed Medications/Allergies reviewed     chronic kidney disease:    Seizures:    COPD:    Atrial Fibrillation:    MRSA greater than 6 months ago:    asthma:    pneumonia:    C H F:    HTN:    Home Medications: Medication Instructions Status  omeprazole 20 mg oral delayed release capsule 1 cap(s) orally 2 times a day Active  Coreg 3.125 mg oral tablet 1 tab(s) orally 2 times a day Active  Advair Diskus 250 mcg-50 mcg inhalation powder 1 puff(s) inhaled 2 times a day Active  Proventil HFA 90 mcg/inh inhalation aerosol 2 puff(s) inhaled every 4 hours, As Needed- for Shortness of Breath  Active  pravastatin 40 mg oral tablet 1 tab(s) orally once a day (at bedtime) Active  Klor-Con 10 oral tablet, extended release 1 tab(s) orally once a day Active  quinapril 20 mg oral tablet 1 tab(s) orally once a day Active  furosemide 40 mg oral tablet 1 tab(s) orally 2 times a day Active   EKG:   Interpretation arial fibrillation with variable ventricular response.    No Known Allergies:     Impression 54 yo male with history of dilated cardiomyopathy with ef of 25-30% who has had frequent admissions for chf. He has been intermitantly compliant with his medicaitons. CXR on this admission reveals at least moderate chf. His bnp is elevated to 30K. Some evidene of possible bronchitis on cxr as well. Does not appear to have had an acute ischemic event.l His ekg reveals his chornic afib which is  fairly well controlled rate whise. Has stressed importance of compliance with meds and low sodium diet.    Plan 1. Continue with current meds including carvedilol, ace i and diuresis following renal function 2. Agree with emperic antibiotics. 3. Would not proceed with invasive evalaution at present as he has not ruled in for mi.  4  Not an ideal cnadidate for chronic anticoagulation due to compliance issues. 5. Cointinue with asa. 6. Discussed importance of low sodum diet.   Electronic Signatures: Dalia HeadingFath, Tiffiany Beadles A (MD)  (Signed 19-Dec-13 21:42)  Authored: General Aspect/Present Illness, History and Physical Exam, Review of System, Past Medical History, Home Medications, EKG , Allergies,  Impression/Plan   Last Updated: 19-Dec-13 21:42 by Dalia HeadingFath, Tylisa Alcivar A (MD)

## 2014-05-11 NOTE — H&P (Signed)
PATIENT NAME:  Drew Lynch, Drew Lynch MR#:  [id number] DATE OF BIRTH:  Oct 14, 1960  DATE OF ADMISSION:  08/09/2012  PRIMARY CARE PROVIDER:  [facility]   ED REFERRING:  Dr. Cyril Loosen   CHIEF COMPLAINT:  Shortness of breath, acute respiratory failure.   HISTORY OF PRESENT ILLNESS:  The patient is a 54 year old African-American male with history of severe CHF, with EF less than 25%, as well as moderate mitral regurg, history of atrial fibrillation, history of hypertension, who last was admitted in December with acute on chronic systolic CHF. He states that he was doing fine. He went to see his regular doctors at [facility], and his blood pressure, everything was doing good, and then this morning he woke up with acute onset of shortness of breath. He was gasping for breath. The patient reports that he has not gained any weight or has not noticed any swelling. He has had a dry cough for the past few days. He has not had any chest pain. The patient came to the ED. In the ED, he was noted to be tachypneic and in acute respiratory failure, had to be placed on BiPAP. The patient currently is on BiPAP. He denies any fevers or chills. He denies any abdominal pain. Denies any nausea or vomiting, but complains of diarrhea for the past 2 days. Also states that he does complain of abdominal pain. He denies any skin rash.   PAST MEDICAL HISTORY: 1. History of systolic CHF. Last echocardiogram was done in November 2013. EF less than 25%, right ventricular systolic pressure was 50 to 60 mmHg, moderate tricuspid regurg, EF less than 25%, left ventricle is mildly dilated, right ventricle is mildly dilated, the right atrium is mildly dilated. There is aortic regurg.  2.  History of atrial fibrillation, currently in normal sinus rhythm.  3.  History of right pleural effusion.  4.  Chronic kidney disease stage III, and his creatinine on December 21 was 1.59.  5.  History of COPD.   6.  Hypertension.  7.   Hyperlipidemia.  8.  Gout.  9.  GERD.   10.  History of seizure activity in the past.  11.  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  PAST SURGICAL HISTORY:  Status post hernia repair and laser surgery to both eyes.   SOCIAL HISTORY: History of smoking, quit. No drug use or alcohol abuse. He is unemployed, lives on disability.   FAMILY HISTORY:  Positive for both parents deceased. His father died from cancer, not sure what type. His mother died from complication of pneumonia.   CURRENT MEDICATIONS:  Advair 250/50, 1 puff b.i.d., allopurinol 100 daily, aspirin 81, 1 tab p.o. daily, carvedilol 3.125, 1 tab p.o. b.i.d., diltiazem 120, 1 tabs p.o. daily, Lasix 20, 2 tabs 2 times a day, Klor-Con 10 mEq 1 tab p.o. b.i.d., omeprazole 20, 1 tab p.o. b.i.d., Proventil 2 puffs 4 times a day as needed, quinapril 20 daily, Spiriva 18 mcg daily, vitamin D3, 50,000 international units once a month.   REVIEW OF SYSTEMS:   CONSTITUTIONAL:  Denies any fevers. Complains of fatigue, weakness. No pain. No weight loss. No weight gain.  EYES:  He has chronic blindness in the right eye.  EARS, NOSE, THROAT:  No tinnitus. No ear pain. No hearing loss. No difficulty swallowing.  RESPIRATORY:  Does complain of dry cough. No wheezing. Has history of COPD.   CARDIOVASCULAR: Denies any chest pain. Denies any edema. Denies any orthopnea. Complains  of dyspnea on exertion. Has history of atrial fibrillation. GASTROINTESTINAL: Denies any nausea, vomiting. No abdominal pain. No hematemesis. Does complain of some diarrhea for the past 2 days. Denies any hematemesis, melena. No rectal bleeding.  GENITOURINARY: Denies any dysuria, hematuria, renal calculus or frequency. ENDOCRINE:  Denies any polyuria, nocturia or thyroid problems.  HEMATOLOGIC/LYMPHATIC: Denies any major bruisability or bleeding.  SKIN:  No acne. No rash. No changes in mole, hair or skin.  MUSCULOSKELETAL: Denies any pain in neck, back or  shoulder.  NEUROLOGIC:  No numbness, no CVA, no TIA or seizures.  PSYCHIATRIC:  No anxiety. No insomnia. No ADD.   PHYSICAL EXAMINATION: VITAL SIGNS: Temperature 97.6, pulse 86, respirations 42, blood pressure initially was 177/118, currently is 133/91, he is 89% on room air.  GENERAL: The patient is a well-developed, African-American male, currently in acute respiratory failure, using accessory muscles, is on BiPAP.  HEENT:  He has clouding of his right pupil as a result of cataracts, but they are equally round and reactive to light and accommodation bilaterally. Oropharynx is clear, without any exudate.  Head is normocephalic, atraumatic. Nose: There is no nasal lesion or drainage. Ears: There is no erythema or drainage. Mouth:  There is no exudate.  NECK:  Supple and symmetric. Positive JVD.  RESPIRATORY:  He has accessory muscle usage, with bilateral crackles. Occasional wheezing.  CARDIOVASCULAR:  Regular rate and rhythm. Positive systolic murmur at the right sternal border. PMI is not displaced.  ABDOMEN: Soft, nontender, nondistended. Positive bowel sounds x 4. No hepatosplenomegaly.  GENITOURINARY:  Deferred.  MUSCULOSKELETAL: There is no swelling.  SKIN:  No rash.  LYMPHATICS:  No lymph nodes palpable.  VASCULAR:  Good DP, PT pulses.  NEUROLOGIC:  Awake, alert, oriented x 3. No focal deficits.  PSYCHIATRIC:  Not anxious or depressed.   EVALUATIONS:  Chest x-ray shows findings consistent with bilateral pulmonary infiltrates and congestive heart failure.   LABS:  BNP is 6663. Glucose 144, BUN 29, creatinine 1.66, sodium 139, potassium 4.5, chloride 108, CO2 is 25, calcium 8.9. LFTs showed an AST of 39. CPK is 271, CK-MB 4.1. Troponin 0.02. WBC 11.3, hemoglobin 12.1, platelet count 247. ABG:  pH of 7.34, pCO2 of 40, pO2 of 175; this is, however, on BiPAP.   ASSESSMENT AND PLAN:  The patient is a 54 year old African-American male with history of systolic congestive heart failure, who  presents with acute onset of shortness of breath, currently on BiPAP.   1.  Acute respiratory failure, likely due to acute systolic congestive heart failure, possible chronic obstructive pulmonary disease flare. At this time, will treat him with IV Lasix b.i.d. Will place him on IV Solu-Medrol nebulizers. in light of his cough and COPD flare, I will place him on IV Avelox. We will also repeat his echo, since it has already been 6 months. I will have Cardiology evaluate the patient.  2.  Elevated CK-MB and total CPK. Will follow his cardiac enzymes.  3.  Chronic kidney disease, currently close to his baseline.  4. Acute on chronic obstructive pulmonary disease exacerbation. Will continue Advair. Will place him on Xopenex nebulizers, and place him on IV Solu-Medrol and IV Avelox. 5.  Hypertension. Will continue Coreg and Cardizem and ACE inhibitor as taking at home.  6.  History of atrial fibrillation. Currently in normal sinus rhythm. We will place him on aspirin. Continue Cardizem.  7.  Gastroesophageal reflux disease. Continue proton pump inhibitor as taking at home.  8.  Miscellaneous. Will  give Lovenox for deep vein thrombosis prophylaxis.    NOTE:  50 minutes critical care time spent.    ____________________________ Lacie ScottsShreyang H. Allena KatzPatel, MD shp:mr D: 08/09/2012 19:12:38 ET T: 08/09/2012 19:37:58 ET JOB#: [id number]  cc: Tanekia Ryans H. Allena KatzPatel, MD, <Dictator> Charise CarwinSHREYANG H Onyekachi Gathright MD ELECTRONICALLY SIGNED 08/22/2012 9:01

## 2014-05-11 NOTE — H&P (Signed)
PATIENT NAME:  Drew Lynch, Drew Lynch MR#:  [id number] DATE OF BIRTH:  10-13-1960  DATE OF ADMISSION:  01/01/2013  PRIMARY CARE PHYSICIAN:  [facility].  CHIEF COMPLAINT: Shortness of breath.   HISTORY OF PRESENT ILLNESS: This is a very pleasant 54 year old male with a history of cardiomyopathy, EF of 20%, COPD on 2 liters of oxygen with chronic respiratory failure, chronic kidney disease stage 3, seizure disorder, not on any medications; hypertension, GERD and atrial fibrillation presents with the above complaint. The patient says that over the past couple of days he has been having shortness of breath with frothy white sputum and dyspnea on exertion. He can only walk half a block without feeling short of breath. He was seen by PCP on Thursday and Friday. At that time, they diagnosed him with pneumonia and he was sent home with Levaquin antibiotic. He was also placed on Lasix for possible fluid overload. He comes in today because his symptoms have not improved.   REVIEW OF SYSTEMS: CONSTITUTIONAL: Positive chills. No fever. Positive fatigue and weakness. No weight loss, weight gain.  EYES;  No blurred or double vision.  ENT: No ear pain, hearing loss, postnasal drip or discharge.  RESPIRATORY: Positive cough with white frothy sputum, some mild wheezing, no hemoptysis. Positive dyspnea. Positive history of COPD.   CARDIOVASCULAR: No chest pain, orthopnea, edema, arrhythmia. Positive dyspnea on exertion.  No palpitations or syncope.   GASTROINTESTINAL:  No nausea, vomiting, diarrhea, abdominal pain, melena. Or ulcers.  GENITOURINARY:  No dysuria or hematuria.   ENDOCRINE: No polyuria or polydipsia.   HEMATOLOGY AND LYMPHATICS:  Positive anemia, no easy bruising.  SKIN: No rash or lesions.  MUSCULOSKELETAL: Positive history of gout.  NEURO:  No history of CVA, TIA or seizures.  PSYCH:  No history of anxiety or depression.   PAST MEDICAL HISTORY: 1.  Cardiomyopathy, EF of 25%.  2.  Chronic  kidney disease, stage 3.  3.  History of COPD with chronic respiratory failure, on oxygen.  4.  History of seizure disorder.  5.  Hypertension.  6.  GERD.  7.  History of atrial fibrillation.   PAST SURGICAL HISTORY:  1.  Hernia surgery.  2.  Laser surgery to eyes.   ALLERGIES: No known drug allergies.   SOCIAL HISTORY: The patient denies any tobacco, alcohol or IV drug use.   FAMILY HISTORY: Positive for cancer and coronary artery disease.   MEDICATIONS: 1.  Vitamin D 50,000 international units once a month.  2.  KCl 10 mEq daily.  3.  Omeprazole 20 mg daily.  4.  Quinapril 20 mg daily.  5.  Aspirin 81 mg daily.  6.  Lasix 40 mg daily.  7.  Allopurinol 100 mg daily.  8.  Diltiazem 120 mg daily.   PHYSICAL EXAMINATION: VITAL SIGNS: The patient's temperature is 98.1, pulse 99, respirations 22, blood pressure 124/92, 99% on 2 liters.  GENERAL: The patient is alert, oriented, not in acute distress.  HEENT: Head is atraumatic. Pupils are round and reactive. Sclera are anicteric. Mucous membranes are moist. Oropharynx clear.   NECK: Supple. She does have JVD up to the level of the jaw. No enlarged thyroid.  CARDIOVASCULAR: Regular rate and rhythm. There is a 2 out of 6 holosystolic murmur heard throughout the precordium. PMI is laterally displaced.  LUNGS:  Decreased breath sounds at the right base with dullness to percussion at the right base and mild crackles. No wheezing or rhonchi are heard. Normal chest expansion.  BACK: No CVA or vertebral tenderness.  ABDOMEN: Bowel sounds positive. Nontender, nondistended. No hepatosplenomegaly. No ecchymosis or rigidity.  MUSCULOSKELETAL: No pathology to digits or nails. Extremities move x 4.  SKIN:  Without rash or lesions.  NEURO:  Cranial nerves II through XII grossly intact. There are no focal deficits. Motor strength is 4 out of 4 bilaterally and symmetrically.   LABORATORY, DIAGNOSTIC AND RADIOLOGICAL DATA:  1.  Sodium 137,  potassium 3.5, chloride 105, bicarb 26, BUN 20, creatinine 1.86, glucose 103. BNP 14,021. Total protein 7.2, albumin 3.1, bilirubin 0.5, alk phos 73, AST 17, ALT 19. Troponin 0.03. White blood cells 8.2, hemoglobin 11.2, hematocrit 36, platelets are 214.  2.  Chest x-ray shows right lower lobe consolidation versus pleural effusion and atelectasis.  3.  EKG sinus rhythm with PACs, no ST elevation or depression.   ASSESSMENT AND PLAN:  This is a 54 year old male with history of cardiomyopathy, ejection fraction of 25%, who presents with acute on chronic respiratory failure secondary to acute on chronic systolic exacerbation of congestive heart failure as well as community-acquired pneumonia.  1.  Acute on chronic respiratory failure secondary to acute on chronic systolic heart failure in addition to community-acquired pneumonia. Plan as outlined below.  2.  Acute on chronic systolic heart failure. Last ejection fraction was reported as 25%. The patient's BNP is elevated. He had frothy white sputum. Will go ahead and treat with Lasix 20 IV q.8 hours. Follow I's and O's and daily weights. Continue outpatient diltiazem medication. His primary cardiologist is Dr. Nehemiah Massed.  If needed, we can consult him. Will monitor patient's symptoms.  3.  Community-acquired pneumonia. The patient was partially treated on Levaquin, I am not sure what dose he was on, but will start with Rocephin and azithromycin. Blood cultures have been ordered in the ER.  4.  History of chronic obstructive pulmonary disease, on chronic oxygen with chronic respiratory failure. This seems to be stable at this time. Will continue some inhalers that he is on including Advair and tiotropium and will order DuoNebs if needed.  5.  History of hypertension. We will continue quinapril and diltiazem.  6.  Acute kidney injury on chronic injury. At this time, it is likely secondary to cardiorenal syndrome. We will continue to monitor his creatinine. The  patient will be on lisinopril. We will monitor closely. If needed, we can consult nephrology.  7.  The patient is full CODE STATUS.   TIME SPENT: Approximately 40 minutes.    ____________________________ Donell Beers. Benjie Karvonen, MD spm:cs D: 01/01/2013 19:59:52 ET T: 01/01/2013 20:40:49 ET JOB#: [id number]  cc: Isabella Roemmich P. Benjie Karvonen, MD, <Dictator> Donell Beers Shonette Rhames MD ELECTRONICALLY SIGNED 01/02/2013 12:23

## 2014-05-11 NOTE — H&P (Signed)
PATIENT NAME:  Drew Lynch, Drew Lynch MR#:  [id number] DATE OF BIRTH:  [birth year]  DATE OF ADMISSION:  11/08/2012  PRIMARY CARE PHYSICIAN:  At the [facility].   CHIEF COMPLAINT: Cough.   HISTORY OF PRESENT ILLNESS: This is a 54 year old man with multiple medical issues, presents to the ER with a cough. Last week, he was given Augmentin for a cold. He was given a 5-day course and he took 3 days of the antibiotic. He states that he has been coughing up slight blood-tinged sputum. His left side of his chest hurts, lasting for a few hours at a time.  That chest pain comes and goes over the last 2 to 3 days. He is concerned about fluid in the lungs because he has a history of heart failure and came to the ER for further evaluation. In the ER, when he presented, temperature 97.7, respirations 18, pulse ox ranging between 94% and 97% on room air, blood pressure 119/85 and pulse of 74. Hospitalist services were contacted for further evaluation.   PAST MEDICAL HISTORY: Congestive heart failure with cardiomyopathy, EF 25%, chronic kidney disease, COPD, seizure disorder, hypertension, gastroesophageal reflux disease and atrial fibrillation.   PAST SURGICAL HISTORY: Hernia surgery, laser surgery to eyes and cysts removed.   ALLERGIES: No known drug allergies.   SOCIAL HISTORY: Quit smoking. No alcohol or drug use. Lives with his friend.   FAMILY HISTORY: Mother died from cancer, father died of an MI in his 71s.   MEDICATIONS: Include Advair Diskus 250/50 one inhalation twice a day, allopurinol 100 mg daily, aspirin 81 mg daily, diltiazem 120 mg extended-release daily, ferrous sulfate 325 mg twice a day, Lasix 40 mg daily, Augmentin 500 mg twice a day for 5 days, omeprazole 20 mg twice a day, potassium chloride 10 mEq twice a day, ProAir 90 mcg/inhalations 2 puffs every 4 to 6 hours as needed for shortness of breath, quinapril 20 mg daily, Spiriva 1 inhalation daily, vitamin D2 at 50,000 units once a month.    REVIEW OF SYSTEMS: CONSTITUTIONAL: Positive for weight being on the lower side, 152. No fever. No chills. No sweats. Positive for fatigue.  HEENT:  Eyes: He is blind, right eye. Ears, nose, mouth and throat: Positive for runny nose. Positive for sore throat.  CARDIOVASCULAR: Positive for chest pain on the left chest, palpitations.  RESPIRATORY: Positive for shortness of breath. Positive for cough, positive for blood-tinged sputum.  GASTROINTESTINAL: No nausea. No vomiting. No abdominal pain. No diarrhea. No constipation. No bright red blood per rectum. No melena.  GENITOURINARY: No burning on urination. No hematuria.  MUSCULOSKELETAL: No joint pain.  INTEGUMENT: No rashes or eruptions.  NEUROLOGIC: No fainting or blackouts.  PSYCHIATRIC: No anxiety or depression.  ENDOCRINE: No thyroid problems.  HEMATOLOGIC AND LYMPHATIC: No anemia. No easy bruising or bleeding.   PHYSICAL EXAMINATION: VITAL SIGNS: Temperature 97.7, pulse 73, respirations 18, blood pressure 124/88, pulse ox 97% on room air.  GENERAL: No respiratory distress.  EYES: Conjunctivae and lids normal. Pupils equal, round, reactive to light. Extraocular muscles intact. No nystagmus.  EARS, NOSE, MOUTH AND THROAT: Tympanic membranes blocked by wax. Nasal mucosa no erythema. Throat no erythema, no exudate seen. Lips and gums no lesions.  NECK: No JVD. No bruits. No lymphadenopathy. No thyromegaly. No thyroid nodules palpated.  RESPIRATORY: Lungs rhonchi, right base.  CARDIOVASCULAR: S1, S2 normal. No gallops, rubs or murmurs heard. Carotid upstroke 2+ bilaterally. No bruits. Dorsalis pedis pulses 2+ bilaterally, trace edema of the  lower extremity.  ABDOMEN: Soft, nontender. No organomegaly/splenomegaly. Normoactive bowel sounds. No masses felt.  LYMPHATIC: No lymph nodes in the neck.  MUSCULOSKELETAL: No clubbing, edema or cyanosis.  SKIN: No ulcers or lesions.  NEUROLOGIC: Cranial nerves II through XII grossly intact. Deep  tendon reflexes 2+ bilateral lower extremity.  PSYCHIATRIC: The patient is oriented to person, place and time.   LABORATORY AND RADIOLOGICAL DATA:  Chest x-ray right lung base pneumonia, cardiomegaly. White blood cell count 7.3, H and H 11.4 and 35.3, platelet count 199. Glucose 94, BUN 16, creatinine 1.71, sodium 141, potassium 3.6, chloride 111, CO2 25, calcium 9.2. Troponin negative at 0.04. BNP 9879.   ASSESSMENT AND PLAN: 1.  Partially treated pneumonia. The patient was given a 5-day course of Augmentin as outpatient. He took 3 days of this. I think switching the antibiotic over to Levaquin 500 mg daily for a total of 10-day course. He is receiving an IV dose here. I will prescribe another 9-day course as outpatient. The patient can be discharged from the hospital since his vitals are stable. The ER physician mentioned that the pulse ox did go down to 90% while walking around but pulse ox when I was talking to him on room air was 97% the entire time. I do recommend a [facility] to ensure clearing of the pneumonia.  2.  Chronic obstructive pulmonary. I do not see any evidence of exacerbation at this point. I do not need to give steroids. Continue his normal inhalers.  3.  Hypertension. Blood pressure is stable on his usual medications.  4.  Chronic kidney disease, stage 3. Continue to follow up with Dr. Wynelle LinkKolluru as outpatient.  5.  History of congestive heart failure and cardiomyopathy. No signs of heart failure at this time. Continue outpatient medications which include Lasix, quinapril.  6.  Atrial fibrillation. The patient is rate controlled on diltiazem. Anticoagulation with aspirin only secondary to noncompliance.   TIME SPENT ON ER CONSULT AND DISCHARGE: 55 minutes.    ____________________________ Herschell Dimesichard J. Renae GlossWieting, MD rjw:cs D: 11/08/2012 14:22:00 ET T: 11/08/2012 14:44:57 ET JOB#: [id number]  cc: [facility] Realharles [facility] Zeriyah Wain J. Renae GlossWieting, MD, <Dictator>    Salley ScarletICHARD J Aragorn Recker MD ELECTRONICALLY SIGNED 11/14/2012 11:29

## 2014-05-11 NOTE — Consult Note (Signed)
General Aspect Pt is a 54 yo male with history of severe dilated cardiomyopathy with ef of 20-25%, history of atrial fibrilation, chronic renal insufficiency and history of poor medical compliance who was admitted with progressive shortness of breath with evidence of congestive heart faillure on cxr. . He states he is taking most of his medicaitons but is not sure of all of them. Complains of non productive cough. He complains of weakness and fatigue. He has ruled out for an mi with trivial troponin of 0.07. Has improved with diuresis. Has CKD III with serum creatinine near his baseline. He denies chest pain. Blood cultures have revealed gram positive cocci. EKG reveals sinus tachycardia with no ischemia or injury.   Physical Exam:  GEN no acute distress   HEENT PERRL, hearing intact to voice   NECK supple   RESP normal resp effort  no use of accessory muscles  rhonchi   CARD Regular rate and rhythm  Normal, S1, S2  Murmur   Murmur Systolic   Systolic Murmur axilla   ABD denies tenderness  normal BS   LYMPH negative neck, negative axillae   EXTR negative cyanosis/clubbing, positive edema   SKIN normal to palpation   NEURO cranial nerves intact, motor/sensory function intact   PSYCH alert, poor insight   Review of Systems:  Subjective/Chief Complaint shortness of breath with nonproductive cough and weaness   General: Fatigue  Weakness   Skin: No Complaints   ENT: No Complaints   Eyes: No Complaints   Neck: No Complaints   Respiratory: Short of breath   Cardiovascular: Dyspnea  Orthopnea   Gastrointestinal: No Complaints   Genitourinary: No Complaints   Vascular: No Complaints   Musculoskeletal: No Complaints   Neurologic: No Complaints   Hematologic: No Complaints   Endocrine: No Complaints   Psychiatric: No Complaints   Review of Systems: All other systems were reviewed and found to be negative   Medications/Allergies Reviewed Medications/Allergies  reviewed   Home Medications: Medication Instructions Status  diltiazem 120 mg/24 hours oral capsule, extended release 1 cap(s) orally once a day Active  Advair Diskus 250 mcg-50 mcg inhalation powder 1 puff(s) inhaled 2 times a day Active  omeprazole 20 mg oral delayed release capsule 1 cap(s) orally 2 times a day Active  Klor-Con 10 oral tablet, extended release 1 tab(s) orally 2 times a day Active  furosemide 20 mg oral tablet 2 tab(s) orally 2 times a day Active  Proventil HFA 90 mcg/inh inhalation aerosol 2 puff(s) inhaled every 4 hours, As Needed- for Shortness of Breath  Active  quinapril 20 mg oral tablet 1 tab(s) orally once a day Active  carvedilol 3.125 mg oral tablet 1 tab(s) orally 2 times a day Active  Spiriva 18 mcg inhalation capsule 1 each inhaled once a day Active  Vitamin D3 50,000 intl units oral capsule 1 cap(s) orally once a month Active  allopurinol 100 mg oral tablet 1 tab(s) orally once a day Active  aspirin 81 mg oral tablet 1 tab(s) orally once a day Active   EKG:  Interpretation sr with non specific st t wave changes. No change from baseline    No Known Allergies:    Impression 54 yo male with history of dilated cardiomyopathy with ef of 25-30% who has had frequent admissions for chf. He has been intermitantly compliant with his medications. He states he has been compliant with his meds prior to this visit but is unsure of the meds.  CXR  mild to moderate chf. His bnp is elevated to Has improved with diuresis , and oxygen. Blood cultures suggest gram positive cocci. Now on vanco. Creatinine increased somewhat with diuresis. Will reduce iv lasix to once a day and follow renal funciton. Has acute on chronic systollic heart failure.   Plan 1. Continue with current meds including carvedilol, ace i. 2. Agree with vancomycin given blood culture result 3. Decrease diuresis given worsening renal function 4. Has ruled out for mi. Will discontinue ntroglycerin  ointment 5. Low sodium diet 6. Medical compliance discussed.   Electronic Signatures: Dalia Heading (MD)  (Signed 24-Jul-14 07:37)  Authored: General Aspect/Present Illness, History and Physical Exam, Review of System, Home Medications, EKG , Allergies, Impression/Plan   Last Updated: 24-Jul-14 07:37 by Dalia Heading (MD)

## 2014-05-11 NOTE — Discharge Summary (Signed)
PATIENT NAME:  Drew Lynch, Drew Lynch MR#:  [id number] DATE OF BIRTH:  1960-06-09  DATE OF ADMISSION:  08/09/2012 DATE OF DISCHARGE:  08/16/2012  DISCHARGE DIAGNOSES: 1.  Acute on chronic respiratory failure likely due to acute on chronic heart failure, systolic in nature, close to baseline now. 2.  Acute on chronic systolic heart failure with cardiomyopathy, now at baseline.  3.  Increased troponin likely secondary to demand ischemia, no myocardial infarction. 4.  Atrial fibrillation with well-controlled rate, no anticoagulation due to concern for compliance issue.  5.  Bradycardia. Holding Coreg.  6.  Acute on chronic obstructive pulmonary disease exacerbation, now improving on steroids and antibiotics.  7.  False positive blood culture for staph epidermidis growing. 8.  Increased glucose likely due to steroids with hemoglobin A1c of 5.2.  9.  Low TSH likely due to stress with a low T3.  10.  Acute on chronic kidney disease, stage 2, likely due to overdiuresis, now close to his baseline.   SECONDARY DIAGNOSES: 1.  History of congestive heart failure, ejection fraction less than 25%.  2.  History of atrial fibrillation.  3.  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 4.  Chronic obstructive pulmonary.  5.  Hypertension.  6.  Hyperlipidemia.  7.  Gout. 8.  Gastroesophageal reflux disease.  9.  History of seizure activity.  10.  Right eye blindness.   CONSULTATION: 1.  Nephrology, Dr. Cherylann Ratel.  2.  Physical therapy.  3.  Cardiology, Dr. Lady Gary.  LABORATORY AND RADIOLOGICAL DATA: Chest x-ray on the 22nd of July showed findings concerning for pulmonary edema.  Blood cultures x 2, both aerobic and anaerobic, was growing staph epidermidis, likely contaminant.  Blood cultures x 2 repeated on the 24th of July were negative.  She had a free T3 that was low with a value of 1.1.   HISTORY AND SHORT HOSPITAL COURSE: The patient is a 54 year old male with the  above-mentioned medical problems, was admitted for acute on chronic respiratory failure secondary to CHF. Please see Dr. Serita Grit Patel's dictated history and physical for further details. The patient had borderline elevated cardiac enzymes for which cardiology consultation was obtained with Dr. Lady Gary, who felt this to be supply/demand ischemia and no MI. The patient was also found to be in NYHA class IV acute on chronic systolic heart failure, and considering his worsening kidney function Nephrology consultation was obtained who monitored his kidney function very closely with very gentle diuresis. The patient was slowly improving with his creatinine coming close to his baseline. The patient was evaluated by Physical Therapy and was recommended home, discharge with no skilled therapy needs.   On the 29th of July, he was close to his baseline and is being discharged home in stable condition. He uses 2 liters oxygen via nasal cannula at baseline chronically and was provided a portable oxygen tent, which was requested.   PERTINENT DISCHARGE PHYSICAL EXAMINATION:   VITAL SIGNS: On the date of discharge: Temperature 97.5, heart rate 63 per minute, respirations 20 per minute, blood pressure 125/82 mmHg.  He was saturating 100% on 2 liters oxygen via nasal cannula.   CARDIOVASCULAR: S1, S2 normal. No murmurs, rubs or gallops.  LUNGS: Clear to auscultation bilaterally. No wheezing, rales, rhonchi or crepitation.  ABDOMEN: Soft, benign.  NEUROLOGIC: Nonfocal examination. All other physical examination remained at baseline.   DISCHARGE MEDICATIONS: 1.  Quinapril 20 mg p.o. daily.  2.  Advair 250/50 1 puff b.i.d.  3.  Cardizem 120 mg p.o. daily.  4.  Omeprazole 20 mg p.o. b.i.d.  5.  Proventil 2 puffs inhaled every 4 hours as needed.  6.  Klor-Con 10 mEq p.o. b.i.d.  7.  Spiriva once daily.  8.  Vitamin D3, 50,000 international units once a month.  9.  Allopurinol 100 mg p.o. daily.  10.  Aspirin 81 mg p.o.  daily.  11.  Lasix 20 mg 2 tablets p.o. daily.  12.  Prednisone 60 mg p.o. daily, taper 10 mg daily until finished.  13.  Levaquin 250 mg p.o. daily for 2 more days.   DISCHARGE DIET: Low sodium, renal diet.  DISCHARGE ACTIVITY: As tolerated.   DISCHARGE INSTRUCTIONS AND FOLLOWUP:   1.  The patient was instructed to follow up with his primary care physician, Dr. [name], at [facility] in 1 to 2 weeks.  2.  He will need follow-up with [facility], Dr. Lady GaryFath, in 2 to 4 weeks.  3.  With [facility] in 4 to 6 weeks with Dr. Thedore MinsSingh.  TOTAL TIME DISCHARGING THIS PATIENT: 55 minutes.   ____________________________ Ellamae SiaVipul S. Sherryll BurgerShah, MD vss:cb D: 08/16/2012 16:46:42 ET T: 08/16/2012 18:01:21 ET JOB#: [id number]  cc: Chealsey Miyamoto S. Sherryll BurgerShah, MD, <Dictator> Sarah "Sallie" Allena KatzPatel, MD Darlin PriestlyKenneth A. Lady GaryFath, MD Mosetta PigeonHarmeet Singh, MD  Ellamae SiaVIPUL S [facility] MD ELECTRONICALLY SIGNED 08/17/2012 9:45

## 2014-05-11 NOTE — Consult Note (Signed)
Brief Consult Note: Diagnosis: increasing shortness of breath admitted with shortness of breath noted to be in chf. Limited compiance with meds.   Recommend further assessment or treatment.   Comments: Pt with dilated cardiomopathy with ef of less than 20%. Noted to be in chf on admission. Has had admissions similar to this in the past. Noncompliance has been a problem in the past. Will resume home meds and aggressively diruese. Full note to follow. NYHA Class 4 acute on chornic systollic chf.  Electronic Signatures: Dalia HeadingFath, Terique Kawabata A (MD)  (Signed 23-Jul-14 20:10)  Authored: Brief Consult Note   Last Updated: 23-Jul-14 20:10 by Dalia HeadingFath, Bransyn Adami A (MD)

## 2014-05-12 NOTE — Discharge Summary (Signed)
PATIENT NAME:  Drew Lynch, Drew Lynch DATE OF BIRTH:  1960/10/21  DATE OF ADMISSION:  01/01/2013 DATE OF DISCHARGE:  01/04/2013  DISCHARGE DIAGNOSES: 1.  Acute on chronic systolic heart failure. 2.  Chronic obstructive pulmonary disease exacerbation. 3.  Pneumonia.  HOSPITAL COURSE: A 54 year old male patient with chronic systolic heart failure, EF of 04%20% with chronic kidney disease stage II to III, came in because of shortness of breath. BNP was 14,021 on admission. Chest x-ray concerning for CHF and also some right-sided infiltrate and the patient continued and admitted to hospitalist service on telemetry, continued on IV Lasix. The patient's blood cultures have been negative. Initial white count has been 8.2. The patient was treated with Levaquin as an outpatient. Here, he got Rocephin and Zithromax for pneumonia. The patient's symptoms really got improved and he is not hypoxic. The patient's sets are 96% on 2 liters and his symptoms improved. Changed back to his home dose of Lasix take 40 mg daily. The patient's echo is repeated.  EF of 20% to 25% with decreased LV function. The patient discharged home with azithromycin to take for a few more days. Advised to be compliant with his medications and follow up with Dr. [name] regarding his CHF and also COPD.   DISCHARGE MEDICATIONS: 1.  Advair Diskus 250/50 one puff b.i.d. 2.  Allopurinol 100 mg p.o. daily. 3.  Aspirin 81 mg daily. 4.  Azithromycin 500 mg p.o. daily for 3 days. 5.  Cardizem CD 120 mg daily. 6. ( vitamin D50,000 units 1 capsule once a month for 6 months. 7.  Ferrous sulfate 325 mg p.o. daily. 8.  Lasix 20 mg p.o. daily. 9.  Nitroglycerin sublingual p.r.n. for chest pain. 10.  Quinapril 20 mg p.o. daily. 11.  ProAir inhalation 2 puffs every 4 to 6 hours.  12.  KCl 10 mEq p.o. 2 times a day.  13.  Spiriva 18 mcg capsule inhalation daily.  CONDITION:  Stable.  The patient not on beta blockers because of  history of bradycardia on beta blockers and also not on anticoagulation for atrial fibrillation because of noncompliance with medication and the patient had hypokalemia, replaced with potassium.   TIME SPENT ON DISCHARGE PREPARATION:  More than 30 minutes.    ____________________________ Katha HammingSnehalatha Riniyah Speich, MD sk:ce D: 01/04/2013 13:44:50 ET T: 01/04/2013 19:48:53 ET JOB#: [id number]  cc: Katha HammingSnehalatha Roberth Berling, MD, <Dictator> Sarah "Sallie" Allena KatzPatel, MD Katha HammingSNEHALATHA Kilian Schwartz MD ELECTRONICALLY SIGNED 01/21/2013 17:17

## 2014-05-12 NOTE — Discharge Summary (Signed)
PATIENT NAME:  Drew Lynch, Drew Lynch MR#:  [id number] DATE OF BIRTH:  1960-02-01  DATE OF ADMISSION:  01/12/2013 DATE OF DISCHARGE:  01/14/2013  ADMITTING DIAGNOSIS: Congestive heart failure.   DISCHARGE DIAGNOSES: 1.  Systolic congestive heart failure, chronic. 2.  Atrial flutter.   3.  Coronary kidney disease, stage 3.   4.  Chronic obstructive pulmonary disease, stable.  5.  Chest pain. No acute coronary syndrome. Normal cardiac enzymes, likely congestive heart failure.  6.  Iron deficiency anemia.   DISCHARGE CONDITION: Stable.   DISCHARGE MEDICATIONS:  1.  The patient is still to continue Advair Diskus 250/50, 1 puff twice daily.  2. Potassium chloride 10 mEq once 1 p.o. twice daily.  3.  Quinapril 20 mg p.o. daily. 4.  Diltiazem hydrochloride extended release 120 mg p.o. daily.  5.  Aspirin 81 mg daily.  6.  Spiriva 18 mcg inhalation daily.  7.  Carvedilol 3.125 mg twice a day.  8.  Combivent Respimat 100/20, 1 puff 4 times daily as needed.  8.  Torsemide 40 mg p.o. twice daily.  9.  Iron gluconate 240 mg p.o. twice daily.  10.  Home oxygen, with portable tank at 2 liters of oxygen through nasal cannula.   DIET: Low sodium, low cholesterol, regular consistency.   ACTIVITY: Limitations as tolerated.   FOLLOWUP: With Dr. Gwen Pounds in two days after discharge.    CONSULTANTS: Care management, social work.   RADIOLOGIC STUDIES: Chest x-ray, portable single view, the 25th of December, 2014, revealed cardiomegaly and mild edema pattern with small right pleural effusion, suspected CHF. Repeated chest x-ray PA and lateral, the 22nd of December 2014, revealed improved pulmonary edema since the most recent study, marked cardiomegaly. small to moderate bilateral pleural effusions and basilar atelectasis worse on the right.  HISTORY OF PRESENT ILLNESS AND HOSPITAL COURSE: The  patient is a 54 year old African-American male with history of cardiomyopathy with ejection fraction of 25%, also  chronic kidney, COPD on chronic oxygen therapy, history of chronic A. fib, who presents to the hospital with complaints of shortness of breath. Please refer to Dr. Salina April admission note on the day of the 25th of December, 2014. Apparently, the patient started having shortness of breath as well as chest pains, which started on the day of admission, the 25th of December.  He was doing well up until just two days ago when he started having more exertional shortness of breath. Arrival at the Emergency Room his vital signs;  temperature was 97.6, pulse was 84, respiration rate was 22, blood pressure 135/95 and saturation was 92% on 2 liters of oxygen through nasal cannula. The patient was in mild respiratory distress. He had some bibasilar crackles, as well as prolonged inspiratory and expiratory phases. Chest x-ray revealed cardiomegaly and mild edema pattern, as well as pleural effusion which was concerning for CHF.Marland Kitchen He was admitted to the hospital for further evaluation and he was initiated on Lasix IV. He was initiated on low dose of Coreg and continued on ACE inhibitor. With this, his condition improved.  Over a period of his stay in the hospital from the day of admission, the 25th of December through 27th of December, 2014, he diuresed approximately 3.2 liters of fluid, and his oxygenation improved. On the day of discharge, the patient's O2 sats were 98% on 2 liters of oxygen through nasal cannula. However, they dropped down to 89 on room air at rest, as well as on exertion, requiring oxygen therapy at home. The  patient's diuretic, Lasix, was advanced to 40 mg twice daily dose. The patient was advised also to follow his oxygen saturations and get oximeter and follow his fluid status and weight and advance his diuretic doses even higher if needed. He was told to follow up [facility], as well as cardiologist, Dr. Gwen PoundsKowalski, in the next few days after discharge.   On the day of  discharge, the patient's vital signs stable with temperature of 97.8, pulse was ranging from 50s to 70s, respiration rate was 18, blood pressure 100 to 116 systolic and 70s and 60s to 80s diastolic. Oxygen saturation was 98% on 2 liters of oxygen through nasal cannula at rest.  The patient was advised to continue therapy and follow up with primary care physician in the next few days after discharge as mentioned above. In regards to of note, we repeated his chest x-ray, which showed improvement of his condition. 1.  In regards to his chronic kidney disease, stage 3, the patient's kidney function was followed while he was in the hospital and his creatinine remains relatively stable.  On the day of admission, the patient's creatinine was 1.82, the 25th of December, 2014, it remained about stable to be 1.92 on the day of discharge, the 27th of December 2014. It is recommended to follow the patient's creatinine as well as estimated GFR as outpatient and make decisions about changing diuretics if needed or ACE inhibitor if needed.  2.  In regards to his chronic obstructive pulmonary disease, we did not feel that it was chronic obstructive pulmonary disease exacerbation. The patient is to continue his usual medications for chronic obstructive pulmonary disease as well as oxygen therapy. 3.  For chest pains, the patient's cardiac enzymes were checked and they were negative. It was felt that the patient's chest pain was very likely musculoskeletal, related to work of breathing; however, the patient is to follow up with cardiologist next few days after discharge.  4.  The patient was noted to be anemic with hemoglobin, however, remained stable, but iron studies done in the hospital revealed iron deficiency. The patient's iron saturations were only 9. The patient was initiated on iron supplements and recommended to follow up with his primary care physician to get his hemoglobin levels checked as outpatient.  5.  The  patient is being discharged in stable condition with the above-mentioned indications and follow-up. His vital signs stable as mentioned above, he is to follow up in the next few days after discharge.   TIME SPENT: 40 minutes on this patient.   ____________________________ Katharina Caperima Khayman Kirsch, MD rv:NTS D: 01/14/2013 18:16:11 ET T: 01/15/2013 05:22:11 ET JOB#: [id number]  cc: Katharina Caperima Nickoli Bagheri, MD, <Dictator> Lamar BlinksBruce J. Kowalski, MD Phineas Realharles [facility] Zavier Canela MD ELECTRONICALLY SIGNED 01/30/2013 14:08

## 2014-05-12 NOTE — Discharge Summary (Signed)
PATIENT NAME:  Drew Lynch, [name] MR#:  [id number] DATE OF BIRTH:  [birth year]  DATE OF ADMISSION:  04/25/2013 DATE OF DISCHARGE:  04/29/2013  ADMISSION DIAGNOSIS:  Acute on chronic respiratory failure.   DISCHARGE DIAGNOSES: 1.  Acute on chronic respiratory failure.  2.  Acute on chronic systolic heart failure.  3.  Chronic obstructive pulmonary disease exacerbation.   4.  Hospital-acquired pneumonia.  5.  Elevated troponins.  6.  Chronic kidney disease.  7.  Chronic atrial fibrillation with supraventricular tachycardia. 8.  Hypertension.   CONSULTATIONS:  Cardiology and consultation with pulmonary.   DISCHARGE LABORATORY DATA:  Magnesium 2.1, potassium 4.1.  Discharge sodium 139, creatinine 1.85, BUN 35.   HOSPITAL COURSE:  A [age] year old male with a known history of systolic heart failure, EF of 04%20% to 25%.  He presented with shortness of breath.  For further details, please refer to H and P.  1.  Acute on chronic respiratory failure.  The patient was in distress on admission, that is respiratory distress.  This is multifactorial due to acute on chronic systolic heart failure, COPD exacerbation and HCAP.  The patient may need an AICD in the future as he has had episodes of SVT here in the hospital.  Cardiology was consulted.  No need for intervention at this time.   2.  Acute on chronic systolic heart failure, EF of 54%20% to 25% by last echocardiogram.  He diuresed well with IV Lasix.  He will continue his outpatient medications 3.  COPD, acute exacerbation on chronic, the patient improved.  He wears 2 liters of oxygen at home.  4.  HCAP.  The patient was continued on Levaquin and Zosyn.  Blood culture negative to date.  He was afebrile throughout hospitalization.  5.  Elevated troponin secondary to demand ischemia from above issues. 6.  Chronic kidney disease.  His creatinine has much improved.  His baseline is 1.9 to 2.1.  He has chronic kidney disease stage 3.   7.  SVT with chronic atrial  fibrillation.  The patient is not a candidate for anticoagulation due to noncompliance and high risk of complications.  The patient did not follow up as directed.  The patient asymptomatic, but due to low EF should be considered for an AICD in the near future.  Continue Coreg for rate control.  8.  Hypertension.  The patient was continued on Coreg, diltiazem and quinapril.  DISCHARGE MEDICATIONS: 1.  Aspirin 81 mg daily.  2.  Spiriva 18 mcg daily. 3.  Ferrous sulfate 325 mg twice daily.  4.  Vitamin D2 50,000 international units monthly.  5.  Omeprazole 20 mg twice daily.  6.  Nitroglycerin sublingual as needed chest pain.  7.  ProAir 2 puffs 4 times a day as needed.  8.  Tylenol 650 q. 6 hours as needed.  9.  Potassium 10 mEq twice daily.  10.  Quinapril 20 mg daily.  11.  Lasix 40 mg twice daily.  12.  Combivent 1 puff 4 times a day as needed.  13.  Vitamin D 1.25 mg monthly.  14.  Coreg 3.125 twice daily.  15.  Advair Diskus 250/50 twice daily. 16.  Diltiazem 120 mg daily.  17.  Prednisone taper starting at 50 mg, taper by 10 mg every two days. 18.  Levaquin 750 mg daily for five days.   DISPOSITION:  Discharge home with home health, nurse and nurse aide.   DISCHARGE OXYGEN:  2 liters.   DISCHARGE DIET:  Low-sodium.   DISCHARGE ACTIVITY:  No exertion or heavy lifting.   DISCHARGE FOLLOW-UP:  The patient will follow up with Dr. Hillery Aldo in one week as well as his cardiologist Dr. Gwen Pounds in one week.    TIME SPENT:  Approximately 40 minutes on this discharge.  The patient was medically stable for discharge.     ____________________________ Halynn Reitano P. Juliene Pina, MD spm:ea D: 04/29/2013 19:34:30 ET T: 04/30/2013 04:24:05 ET JOB#: [id number]  cc: Caylon Saine P. Juliene Pina, MD, <Dictator> Sarah "Maryruth Hancock, MD Lamar Blinks, MD Janyth Contes Chuong Casebeer MD ELECTRONICALLY SIGNED 04/30/2013 14:12

## 2014-05-12 NOTE — Discharge Summary (Signed)
PATIENT NAME:  Drew Lynch, Drew Lynch MR#:  [id number] DATE OF BIRTH:  09/13/1960  DATE OF ADMISSION:  04/02/2013 DATE OF DISCHARGE:  04/05/2013  For a detailed note, please take a look at the history and physical done on admission by Dr. Jacques NavyAhmadzia.   DIAGNOSES AT DISCHARGE: 1.  Pneumonia.  2.  History of congestive heart failure.  3.  History of cardiomyopathy, ejection fraction of 15%.  4.  Hypertension.  5.  Chronic obstructive pulmonary disease.   DIET:  The patient is being discharged on a low-sodium diet.   ACTIVITY:  As tolerated.   FOLLOW-UP:  Dr. [name] in the next 1 to 2 weeks.    The patient is also being discharged on continuous 2 liters of oxygen.   DISCHARGE MEDICATIONS:  Advair 250/50 1 puff twice daily, Cardizem CD 120 mg daily, aspirin 81 mg daily, Spiriva 1 puff daily, Coreg 3.125 mg twice daily, iron sulfate 325 mg twice daily, vitamin D2 50,000 international units monthly, omeprazole 20 mg twice daily, allopurinol 100 mg daily, sublingual nitroglycerin as needed, albuterol inhaler 2 puffs 4 times daily as needed, Tylenol 650 q. 6 hours as needed, potassium 10 mEq twice daily, quinapril 20 mg daily, Lasix 40 mg twice daily and Levaquin 750 mg q. 48 hours x 10 days.   PERTINENT STUDIES DONE DURING THE HOSPITAL COURSE:  Are as follows:  A chest x-ray done on 04/02/2013 showing right middle lobe infiltrate with associated effusion.  Blood cultures noted to be negative.   HOSPITAL COURSE:  This is a 54 year old male with medical problems as mentioned above, presented to the hospital with shortness of breath and noted to have a right middle lobe pneumonia.  1.  Pneumonia.  This was likely the cause of the patient's shortness of breath.  Initially, there was some concern that it could be underlying CHF as the patient has underlying cardiomyopathy, although the likely source of his shortness of breath was his pneumonia.  The patient was treated for nosocomial pneumonia with  vancomycin, Zosyn and Levaquin.  He was maintained on broad-spectrum antibiotics for about 48 hours.  His blood cultures remained negative.  He clinically feels much better.  He is currently being discharged on just oral Levaquin as he is clinically significantly improved.  He will take Levaquin every 48 hours for the next 10 days, that is five doses.  2.  History of congestive heart failure.  Clinically, the patient had no evidence of CHF.  He will continue his Coreg, ACE inhibitor and Cardizem.  His Lasix was initially held as he had some mild acute renal failure, but he will resume his home dose Lasix upon discharge.  3.  Gout.  The patient had no acute gout attack.  He will continue his allopurinol.  4.  COPD.  The patient had no evidence of COPD exacerbation.  Will continue his Advair and Spiriva.  5.  Chronic anemia.  His hemoglobin remained stable.  He will continue his iron supplements.   CODE STATUS:  THE PATIENT IS A FULL CODE.   DISPOSITION:  He is being discharged home.   TIME SPENT:  40 minutes.    ____________________________ Drew PancakeVivek Lynch. Cherlynn KaiserSainani, MD vjs:ea D: 04/05/2013 16:12:30 ET T: 04/05/2013 23:00:47 ET JOB#: [id number]  cc: Drew PancakeVivek Lynch. Cherlynn KaiserSainani, MD, <Dictator> Sarah "Drew" Allena KatzPatel, MD Drew SirenVIVEK Lynch Shelsie Tijerino MD ELECTRONICALLY SIGNED 04/14/2013 82:9520:24

## 2014-05-12 NOTE — Consult Note (Signed)
Chief Complaint:  Subjective/Chief Complaint Feeling better today .Pt states to be doing ok. Denies cp He c/o  of improved sob. Ready to go home.   VITAL SIGNS/ANCILLARY NOTES: **Vital Signs.:   05-Feb-15 11:44  Vital Signs Type Routine  Temperature Temperature (F) 97.7  Celsius 36.5  Temperature Source oral  Pulse Pulse 64  Respirations Respirations 18  Systolic BP Systolic BP 341  Diastolic BP (mmHg) Diastolic BP (mmHg) 71  Mean BP 84  Pulse Ox % Pulse Ox % 95  Pulse Ox Activity Level  At rest  Oxygen Delivery 2L  *Intake and Output.:   05-Feb-15 08:15  Grand Totals Intake:  240 Output:      Net:  240 24 Hr.:  240  Oral Intake      In:  240  Percentage of Meal Eaten  100   Brief Assessment:  GEN well developed, well nourished, no acute distress   Cardiac Irregular  -- thrills  -- LE edema   Respiratory normal resp effort   Gastrointestinal Normal   Gastrointestinal details normal Soft  Nontender  Nondistended   Lab Results: Routine Chem:  03-Feb-15 04:31   Glucose, Serum  132  BUN  42  Creatinine (comp)  1.99  Sodium, Serum 138  Potassium, Serum 4.4  Chloride, Serum 107  CO2, Serum 29  Calcium (Total), Serum 8.8  Anion Gap  2  Osmolality (calc) 288  eGFR (African American)  43  eGFR (Non-African American)  37 (eGFR values <39m/min/1.73 m2 may be an indication of chronic kidney disease (CKD). Calculated eGFR is useful in patients with stable renal function. The eGFR calculation will not be reliable in acutely ill patients when serum creatinine is changing rapidly. It is not useful in  patients on dialysis. The eGFR calculation may not be applicable to patients at the low and high extremes of body sizes, pregnant women, and vegetarians.)  05-Feb-15 05:06   Glucose, Serum  185  BUN  64  Creatinine (comp)  2.41  Sodium, Serum  135  Potassium, Serum 3.9  Chloride, Serum 100  CO2, Serum 28  Calcium (Total), Serum 9.2  Anion Gap 7  Osmolality (calc)  293  eGFR (African American)  34  eGFR (Non-African American)  29 (eGFR values <6107mmin/1.73 m2 may be an indication of chronic kidney disease (CKD). Calculated eGFR is useful in patients with stable renal function. The eGFR calculation will not be reliable in acutely ill patients when serum creatinine is changing rapidly. It is not useful in  patients on dialysis. The eGFR calculation may not be applicable to patients at the low and high extremes of body sizes, pregnant women, and vegetarians.)  Routine Hem:  03-Feb-15 04:31   WBC (CBC) 5.5  RBC (CBC) 5.65  Hemoglobin (CBC) 13.7  Hematocrit (CBC) 43.2  Platelet Count (CBC) 153  MCV  76  MCH  24.2  MCHC  31.7  RDW  21.8  Neutrophil % 90.2  Lymphocyte % 4.8  Monocyte % 4.8  Eosinophil % 0.0  Basophil % 0.2  Neutrophil # 5.0  Lymphocyte #  0.3  Monocyte # 0.3  Eosinophil # 0.0  Basophil # 0.0 (Result(s) reported on 21 Feb 2013 at 05:36AM.)   Radiology Results: XRay:    02-Feb-15 11:41, Chest PA and Lateral  Chest PA and Lateral   REASON FOR EXAM:    Shortness of breath  COMMENTS:       PROCEDURE: DXR - DXR CHEST PA (OR AP) AND LATERAL  -  Feb 20 2013 11:41AM     CLINICAL DATA:  Shortness of breath with wheezing and cough.    EXAM:  CHEST  2 VIEW    COMPARISON:  01/14/2013    FINDINGS:  Stable asymmetric elevation of the right hemidiaphragm. Small right  pleural effusion again noted. Aeration in the bases is slightly  improved in the interval. Vascular congestion continues to improve  as well. Right hilar fullness is stable. The cardio pericardial  silhouette is enlarged.     IMPRESSION:  Slight interval improvement in aeration.    Cardiomegaly with vascular congestion and stable right hilar  fullness.      Electronically Signed    By: Misty Stanley M.D.    On: 02/20/2013 11:47     Verified By: ERIC A. MANSELL, M.D.,  Cardiology:    02-Feb-15 11:19, ED ECG  Ventricular Rate 87  Atrial Rate 82  QRS  Duration 110  QT 360  QTc 433  R Axis -30  T Axis 127  ECG interpretation   Atrial fibrillation  Left axis deviation  Septal infarct (cited on or before 12-Jan-2013)  ST & T wave abnormality, consider lateral ischemia  Abnormal ECG  When compared with ECG of 12-Jan-2013 14:39,  Atrial fibrillation has replaced Atrial flutter  T wave inversion no longer evident in Anterior leads  ----------unconfirmed----------  Confirmed by OVERREAD, NOT ([id number]), editor PEARSON, BARBARA ([id number]) on 02/21/2013 9:31:58 AM  ED ECG    Assessment/Plan:  Invasive Device Daily Assessment of Necessity:  Does the patient currently have any of the following indwelling devices? none   Assessment/Plan:  Assessment IMP SOB improved Resp Failure Acute /Chronic CHF CM AFIB HTN Hyperlipidemia AFIB Hemeparisis .   Plan PLAN Ambulated well in halls Tele for AFIB ACE/B-blocker Bp control No anticoug/Non compliance Rate conrtol for AFIB Lasix for sob chf Please continue statin Rec PT/OT Inhalers prn Ok to d/c home todaty  F/U with cardiology as outpt   Electronic Signatures: Lujean Amel D (MD)  (Signed 09-Feb-15 14:04)  Authored: Chief Complaint, VITAL SIGNS/ANCILLARY NOTES, Brief Assessment, Lab Results, Radiology Results, Assessment/Plan   Last Updated: 09-Feb-15 14:04 by Lujean Amel D (MD)

## 2014-05-12 NOTE — Discharge Summary (Signed)
Dates of Admission and Diagnosis:  Date of Admission 25-Jun-2013   Date of Discharge 28-Jun-2013   Admitting Diagnosis Pneumonia   Final Diagnosis Bilateral Pneumonia COPD exacerbation Hemoptysis    Chief Complaint/History of Present Illness REASON FOR ADMISSION: Shortness of breath and bilateral shoulder pain.   REFERRING PHYSICIAN:  Ahmed Prima, MD  PRIMARY CARE PHYSICIAN: [facility].   HISTORY OF PRESENT ILLNESS:  This is a very nice 54 year old gentleman who is also a very poor historian. He has history of chronic respiratory failure, on 2 liters of oxygen currently, systolic heart failure with an ejection fraction of 15% to 20%. The patient has been hospitalized on multiple occasions. For what he goes of 2015 he was hospitalized February 2nd to the 5th, March 15th to the 18th and April 7th till the 11th. The last time that he was hospitalized, he was discharged with the diagnosis of acute on chronic respiratory failure, chronic obstructive pulmonary disease exacerbation and hospital-acquired pneumonia. The patient also has history of atrial fibrillation and supraventricular tachycardia and hypertension. The patient has chronic kidney disease, which is stage 3 so the patient comes today with a history of being his normal self yesterday, starting to have some cold symptoms this morning, history of coughing, having some blood-tinged sputum. Because of all the cough this morning, he started having some shoulder pain over both shoulder areas radiating to the scapula. He has been spitting up yellow sputum. He denies any fever or chills but he just felt like his shortness of breath was getting worse and worse. The patient is really not able to give me much information. He is a very poor historian. He said that he has been around kids a lot but no specific sick contacts and he has not had any recent travel. He was recently moving into a friend's house and he states that he is taking  all of his medications. In the Emergency Department, he was noticed to have overall normal vital signs but his respiratory rate was 32 with oxygen saturation of 97% on 2 to 3 liters nasal cannula. Hospitalist services were consulted to admit this patient.   Allergies:  No Known Allergies:   TDMs:  09-Jun-15 02:47   Vancomycin, Trough LAB 16 (Result(s) reported on 27 Jun 2013 at 05:33AM.)  Routine Chem:  09-Jun-15 02:47   Glucose, Serum  140  BUN  35  Sodium, Serum 138  Potassium, Serum 3.9  Chloride, Serum 106  CO2, Serum 26  Calcium (Total), Serum 8.5  Anion Gap  6 (Result(s) reported on 27 Jun 2013 at 03:08AM.)  Osmolality (calc) 286  Creatinine (comp)  2.70  eGFR (African American)  30  eGFR (Non-African American)  26 (eGFR values <68m/min/1.73 m2 may be an indication of chronic kidney disease (CKD). Calculated eGFR is useful in patients with stable renal function. The eGFR calculation will not be reliable in acutely ill patients when serum creatinine is changing rapidly. It is not useful in  patients on dialysis. The eGFR calculation may not be applicable to patients at the low and high extremes of body sizes, pregnant women, and vegetarians.)   PERTINENT RADIOLOGY STUDIES: XRay:    07-Jun-15 09:19, Chest Portable Single View  Chest Portable Single View   REASON FOR EXAM:    cough, dyspnea  COMMENTS:       PROCEDURE: DXR - DXR PORTABLE CHEST SINGLE VIEW  - Jun 25 2013  9:19AM     CLINICAL DATA:  Dyspnea and  cough with left shoulder pain.    EXAM:  PORTABLE CHEST - 1 VIEW    COMPARISON:  04/25/2013 and03/15/2015    FINDINGS:  Lungs are adequately inflated with patchy airspace opacification  over the mid lungs and left base as findings may be due to  multifocal pneumonia versus asymmetric edema. Stable cardiomegaly.  Remainder the exam is unchanged.     IMPRESSION:  Opacification over the perihilar region as well as left mid to lower  lung likely due to  infection, although cannot exclude asymmetric  edema.    Stable cardiomegaly.      Electronically Signed    By: Marin Olp M.D.    On: 06/25/2013 09:48     Verified By: Pearletha Alfred, M.D.,    08-Jun-15 10:02, Chest PA and Lateral  Chest PA and Lateral   REASON FOR EXAM:    eval pneumonia  COMMENTS:       PROCEDURE: DXR - DXR CHEST PA (OR AP) AND LATERAL  - Jun 26 2013 10:02AM     CLINICAL DATA:  Pneumonia.    EXAM:  CHEST  2 VIEW    COMPARISON:  06/25/2013.    FINDINGS:  Cardiomegaly. Pulmonary vascular prominence and interstitial  prominence again noted consistent with congestive heart failure and  interstitial edema. These findings have improved from prior exam. No  pleural effusion or pneumothorax. No acute osseous abnormality.     IMPRESSION:  Improving congestive heart failure with pulmonary interstitial  edema. Underlying pneumonia cannot be excluded.      Electronically Signed    By: Marcello Moores  Register    On: 06/26/2013 10:06         Verified By: Osa Craver, M.D., MD  LabUnknown:  PACS Image    Pertinent Past History:  Pertinent Past History PAST MEDICAL HISTORY: 1.  Systolic heart failure with ejection fraction of 15% to 20%, likely ischemic nature.  2.  Chronic respiratory failure, on 2 liters of oxygen.  3.  Hypertension.  4.  Chronic kidney disease, stage 3.  5.  Chronic atrial fibrillation.  6.  History of noncompliance.  7.  Iron deficiency anemia.  8.  Gastroesophageal reflux disease.  9.  Gout.   Hospital Course:  Hospital Course 54 y/o m with CM EF 20-25%, COPD, HTN presents with SOB  * ARF over CKD - Due to infection and ATN from hypotension Worsened initially but after lasix held and started on IVF. Back to baseline.  * Acute on chronic respiratory failure due to bilateral HCAP and COPD exacerbation O2 for sats > 92% IV abx. Cx NGTD ON IV steroids and transitioned to PO prednisone at d/c.  * Chronic systolic chf-  Stable No signs fluid overload  * Chronic afib  * HTN Bp continued after hypotension resolved  Time spent on discharge on discharge day 40 minutes   Condition on Discharge Fair   Code Status:  Code Status Full Code   PHYSICAL EXAM ON DISCHARGE:  Physical Exam:  GEN no acute distress   HEENT pale conjunctivae   NECK supple  No masses   RESP normal resp effort   CARD regular rate   ABD denies tenderness  soft   EXTR negative edema   DISCHARGE INSTRUCTIONS HOME MEDS:  Medication Reconciliation: Patient's Home Medications at Discharge:     Medication Instructions  advair diskus 250 mcg-50 mcg inhalation powder  1 puff(s) inhaled 2 times a day for asthma/COPD control   aspirin enteric  coated 81 mg oral delayed release tablet  1 tab(s) orally once a day   spiriva 18 mcg inhalation capsule  1 cap(s) via handihaler once a day   carvedilol 3.125 mg oral tablet  1 tab(s) orally 2 times a day   ferrous sulfate 325 mg oral tablet  1 tab(s) orally 2 times a day   vitamin d2 50,000 intl units oral capsule  1 cap(s) orally once a month   omeprazole 20 mg oral delayed release capsule  1 cap(s) orally 2 times a day   nitrostat 0.4 mg sublingual tablet  1 tab(s) sublingual every 5 minutes up to 3 doses as needed for chest pain.   proair hfa cfc free 90 mcg/inh inhalation aerosol  2 puff(s) inhaled 4 times a day, As Needed - for Shortness of Breath   mapap arthritis pain 650 mg oral tablet, extended release  1 tab(s) orally every 6 hours, As Needed for ankle pain   potassium chloride 10 meq oral tablet, extended release  1 tab(s) orally 2 times a day   combivent cfc free 100 mcg-20 mcg/inh inhalation aerosol  1 puff(s) inhaled 4 times a day, As Needed   diltiazem hydrochloride er 120 mg/24 hours oral capsule, extended release  1 cap(s) orally once a day   furosemide 40 mg oral tablet  1 tab(s) orally once a day   duoneb 0.5 mg-2.5 mg/3 ml inhalation solution  3 milliliter(s) inhaled  4 times a day, As Needed - for Shortness of Breath. nebuliser machine and supplies   amoxicillin-clavulanate 875 mg-125 mg oral tablet  875 milligram(s) orally 2 times a day   levofloxacin 750 mg oral tablet  1 tab(s) orally every 48 hours   prednisone 10 mg oral tablet  Start at 60 mg and taper by 10 mg daily until complete    STOP TAKING THE FOLLOWING MEDICATION(S):    quinapril 20 mg oral tablet: 1 tab(s) orally once a day for heart  Physician's Instructions:  Home Oxygen? Yes   Portable Tank? Yes   Oxygen delivery at home: 2L   Diet Low Sodium   Activity Limitations As tolerated   Return to Work Not Applicable   Time frame for Follow Up Appointment 1-2 weeks  PCP   Time frame for Follow Up Appointment 2-4 weeks  [facility] surgical. Inguinal hernia   Electronic Signatures: Graig Hessling, Lottie Dawson (MD)  (Signed 15-Jun-15 08:17)  Authored: ADMISSION DATE AND DIAGNOSIS, CHIEF COMPLAINT/HPI, Allergies, PERTINENT LABS, PERTINENT RADIOLOGY STUDIES, PERTINENT PAST HISTORY, HOSPITAL COURSE, PHYSICAL EXAM ON DISCHARGE, [place], PATIENT INSTRUCTIONS   Last Updated: 15-Jun-15 08:17 by Alba Destine (MD)

## 2014-05-12 NOTE — H&P (Signed)
PATIENT NAME:  Drew Lynch, Drew Lynch MR#:  [id number] DATE OF BIRTH:  [birth year]  DATE OF ADMISSION:  04/25/2013  PRIMARY CARE PHYSICIAN: [facility].   REQUESTING PHYSICIAN:  Dr. Governor Rooksebecca Lord.   CHIEF COMPLAINT: Shortness of breath, fever.   HISTORY OF PRESENT ILLNESS: The patient is a 54 year old male with a known history of chronic respiratory failure on 2 liters oxygen, chronic systolic heart failure with EF of 15% to 20%, who was recently discharged on 18th of March after a three-day hospital stay, and the patient was doing okay for at least about 2 weeks, but then started again having fever, cough, congestion and shortness of breath for about a week. It has been getting worse. He was also hypoxic up to 84% on room air, and he was also feeling more and more tired. His cough had yellow productive sputum, and he was unable to manage at home and decided to come to the Emergency Department. While in the ED, he had a temperature of 103.1. Heart rate was 107 per minute, respirations 22 per minute, and he was saturating 84% on room air. His chest x-ray in the ED showed right middle lobe infiltrate, and he is being admitted for further evaluation and management.   PAST MEDICAL HISTORY: 1.  Cardiomyopathy with chronic systolic heart failure; EF of 56%15% to 20%.  2.  CKD stage III.  3.  Chronic respiratory failure.  4.  COPD on 2 liters oxygen.  5.  Hypertension.  6.  Chronic atrial fibrillation with a history of noncompliance; not a good candidate for anticoagulation.    PAST SURGICAL HISTORY: 1.  Hernia surgery.  2.  Laser eye surgery.   ALLERGIES: No known drug allergies.   SOCIAL HISTORY:  Quit smoking 20 years ago.  Denies alcohol or drug use. Lives with a friend in a trailer.   FAMILY HISTORY: Positive for cancer and coronary artery disease.   HOME MEDICATIONS:   1.  Advair 250/50, 1 puff b.i.d.  2.  Allopurinol 100 mg p.o. daily. 3.  Aspirin 81 mg p.o. daily. 4.  Coreg 3.125 mg  p.o. b.i.d.  5.  Combivent 1 puff inhaled 4 times a day as needed. 6.  Cardizem 120 mg p.o. daily.  7.  Ferrous sulfate 325 mg p.o. b.i.d.  8.  Lasix 40 mg p.o. b.i.d.  9.  Mapap 650 mg p.o. every 6 hours as needed.  10.  Nitrostat 0.4 mg sublingual every 5 minutes as needed.  11.  Omeprazole 20 mg p.o. b.i.d.  12.  Potassium chloride 10 mEq p.o. b.i.d.  13.  ProAir 2 puffs inhaled 4 times a day as needed.  14.  Quinapril 20 mg p.o. daily.  15.  Spiriva once daily.  16.  Vitamin D 1.25 mg p.o. once a month.  17.  Vitamin D2, 50,000 International Units 1 capsule once a month.   REVIEW OF SYSTEMS:  CONSTITUTIONAL: Positive for fever, fatigue and weight loss of about 15 pounds in the last few months.  EYES: No blurred or double vision. He does have a cataract in the right eye.  ENT:  No tinnitus or hearing loss.    RESPIRATORY: Positive for cough with yellow sputum, also shortness of breath and congestion.  CARDIOVASCULAR: No chest pain, orthopnea. He does report some edema in both hands.  GASTROINTESTINAL: No nausea, vomiting, diarrhea. Poor p.o. intake.  GENITOURINARY:   No dysuria or hematuria.  ENDOCRINE: No polyuria or nocturia.  HEMATOLOGY: No anemia or easy  bruising.  SKIN: No rash or lesion.  MUSCULOSKELETAL: Positive for arthritis.  NEUROLOGIC: No tingling, numbness or weakness.  PSYCHIATRIC: No history of anxiety or depression.   PHYSICAL EXAMINATION: VITAL SIGNS: Temperature 103.1, heart rate 107 per minute, respirations 22 per minute, blood pressure 137/81 mmHg. He was saturating 84% on room air and 92% on 2 liters oxygen via nasal cannula.  GENERAL: The patient is a 53 year old male lying in bed in minimal acute respiratory distress.  EYES: Pupils equal, round and reactive to light and accommodation.  No scleral icterus. Extraocular muscles intact.  Has a cataract in the right eye. HEENT: Head atraumatic, normocephalic. Oropharynx and nasopharynx clear.  NECK: Supple. No  jugular venous distention. No thyroid enlargement or tenderness.  LUNGS: Decreased breath sounds at bases bilaterally, right more than the left. He has rhonchi throughout the right lower base. No crackles or rales.  ABDOMEN: Soft, nontender, nondistended. Bowel sounds present. No organomegaly or mass. He does have right inguinal hernia, especially when he coughs; it is reducible.  EXTREMITIES: No pedal edema, cyanosis or clubbing. He does have chronic venous stasis changes.  SKIN: No obvious rash or lesion.  NEUROLOGIC:  Cranial nerves III through XII intact. Muscle strength 5/5.  Extremity sensation intact.  PSYCHIATRY: The patient is alert and oriented x 3.   LABORATORY PANEL: Normal BMP except BUN of 22, creatinine 2.13.  BNP of 32,727.  Normal liver function tests except total bilirubin of 1.2. Troponin of 0.08. CBC within normal limits except hemoglobin of 11.2, hematocrit 37.6, platelets 122. UA was negative.   Chest x-ray in the ED showed improvement in the right middle lobe infiltrate, but worsening of the right posterior lower lobe infiltrate. Possible fluid overload and vascular congestion, along with small effusion.   EKG showed atrial fibrillation with left axis deviation. No major ST-T changes.   IMPRESSION AND PLAN: 1. Sepsis with fever, tachycardia, tachypnea with the source being lung. We will start him on IV broad-spectrum antibiotics, monitor him closely.  2.  Pneumonia. We will start him on IV Levaquin and Zosyn. Obtain blood and sputum culture. We will get pulmonary consult.  3. Acute on chronic respiratory failure requiring 2 to 3 liters of oxygen for now; likely due to pneumonia and/or underlying CHF. 4.  Borderline troponin, likely due to supply/demand ischemia. We will monitor him and we will start him on aspirin. Continue beta blockers and ACE inhibitor. Put him on telemetry.  5.  History of cardiomyopathy with ejection fraction of 15% to 20%.  We will hold off  cardiology consult and can consider if needed.  We will obtain daily weights with strict I's and O's.   CODE STATUS: FULL CODE.   Total time taking care of this patient is 55 minutes.      ____________________________ Ellamae Sia. Sherryll Burger, MD vss:dmm D: 04/25/2013 11:17:08 ET T: 04/25/2013 11:53:13 ET JOB#: [id number]  cc: Yanilen Adamik S. Sherryll Burger, MD, <Dictator> [facility] Ellamae Sia [facility] MD ELECTRONICALLY SIGNED 04/29/2013 0:15

## 2014-05-12 NOTE — H&P (Signed)
PATIENT NAME:  Drew Lynch, Drew Lynch MR#:  [id number] DATE OF BIRTH:  1960/08/30  DATE OF ADMISSION:  06/25/2013  REASON FOR ADMISSION: Shortness of breath and bilateral shoulder pain.   REFERRING PHYSICIAN:  Darien Ramus, MD  PRIMARY CARE PHYSICIAN: [facility].   HISTORY OF PRESENT ILLNESS:  This is a very nice 54 year old gentleman who is also a very poor historian. He has history of chronic respiratory failure, on 2 liters of oxygen currently, systolic heart failure with an ejection fraction of 15% to 20%. The patient has been hospitalized on multiple occasions. For what he goes of 2015 he was hospitalized February 2nd to the 5th, March 15th to the 18th and April 7th till the 11th. The last time that he was hospitalized, he was discharged with the diagnosis of acute on chronic respiratory failure, chronic obstructive pulmonary disease exacerbation and hospital-acquired pneumonia. The patient also has history of atrial fibrillation and supraventricular tachycardia and hypertension. The patient has chronic kidney disease, which is stage 3 so the patient comes today with a history of being his normal self yesterday, starting to have some cold symptoms this morning, history of coughing, having some blood-tinged sputum. Because of all the cough this morning, he started having some shoulder pain over both shoulder areas radiating to the scapula. He has been spitting up yellow sputum. He denies any fever or chills but he just felt like his shortness of breath was getting worse and worse. The patient is really not able to give me much information. He is a very poor historian. He said that he has been around kids a lot but no specific sick contacts and he has not had any recent travel. He was recently moving into a friend's house and he states that he is taking all of his medications. In the Emergency Department, he was noticed to have overall normal vital signs but his respiratory rate was 32 with  oxygen saturation of 97% on 2 to 3 liters nasal cannula. Hospitalist services were consulted to admit this patient.   REVIEW OF SYSTEMS:    CONSTITUTIONAL: No fever, fatigue, weakness, weight loss or weight gain.  EYES: No blurry vision, double vision. 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  ENT: No tinnitus, ear pain or hearing loss.  RESPIRATORY: Positive cough starting today. No wheezing. Positive blood-tinged sputum x 1. Negative tuberculosis.  The patient has some painful respirations and his whole chest hurts whenever he coughs.  CARDIOVASCULAR: No orthopnea, syncope, palpitations. He has history of atrial fibrillation. He has muscle aches on his shoulders due to cough but no significant chest pain or pressure.  GASTROINTESTINAL: No dysuria, hematuria, changes in frequency.  ENDOCRINE: No polyuria, polydipsia, polyphagia.  HEMATOLOGIC AND LYMPHATIC: No anemia, easy bruising or swollen glands.  MUSCULOSKELETAL: No significant neck pain or back pain. Positive gout but without any exacerbations.  NEUROLOGIC: No numbness, tingling. No weakness.  PSYCHIATRIC: No insomnia or depression.   PAST MEDICAL HISTORY: 1.  Systolic heart failure with ejection fraction of 15% to 20%, likely ischemic nature.  2.  Chronic respiratory failure, on 2 liters of oxygen.  3.  Hypertension.  4.  Chronic kidney disease, stage 3.  5.  Chronic atrial fibrillation.  6.  History of noncompliance.  7.  Iron deficiency anemia.  8.  Gastroesophageal reflux disease.  9.  Gout.   PAST SURGICAL HISTORY:  Hernioplasty and laser eye surgery.   ALLERGIES: Not known drug allergies.   SOCIAL HISTORY: The patient  recently moved into a house with a friend. He used to be a heavy smoker but quit 20 years ago. He denies any alcohol or drugs.   FAMILY HISTORY: Positive for coronary artery disease in parents and some type of cancer, he cannot tell which kind.   CURRENT MEDICATIONS: Include  1.  Advair 250/50 one puff  twice daily.  2.  Albuterol nebulizations.  3.  Allopurinol 100 mg once a day.  4.  Aspirin 81 mg daily.  5.  Coreg 3.125 mg twice daily.  6.  Combivent 1 puff 4 times daily.  7.  Cardizem 120 mg once daily.  8.  Ferrous sulfate 325 mg twice daily.  9.  Lasix 40 mg twice daily.  10.  Tylenol as needed for pain.  11.  Nitroglycerin sublingual as needed for chest pain.  12.  Omeprazole 20 mg twice daily.  13.  Potassium chloride 10 mEq twice daily.  14.  ProAir 2 puffs 4 times a day.  15.  Travatan 1 drop as needed night.  16.  Quinapril 20 mg p.o. daily.  17.  Spiriva 18 mcg once daily.  18.  Vitamin D.   PHYSICAL EXAMINATION: VITAL SIGNS: Blood pressure 108/68, pulse 70, respirations 28 to 32, temperature 98.1, oxygen saturation 93% on 2 liters.  GENERAL:  The patient is alert and oriented x 3, in no acute distress. No respiratory distress. Hemodynamically stable.  EYES:  Unable to compare due to blindness of the right eye but the left pupil is reactive round. Anicteric sclerae. Pink conjunctivae.  HEENT:  No oral lesions. No oropharyngeal exudates.  NECK: Supple. No JVD. No thyromegaly. No adenopathy. No carotid bruits. No rigidity.  CARDIOVASCULAR: Irregular rate and rhythm. No rubs or gallops. No murmurs appreciated at this moment. No displacement of PMI. No tenderness to palpation of anterior chest wall.  LUNGS: Have significant rales at the level of the left middle lobe. No crackles audible at this moment. No wheezing. No dullness to percussion. No signs of respiratory distress at this moment. The patient is tachypneic but not using accessory muscles.  ABDOMEN: Soft, nontender, nondistended. No hepatosplenomegaly. No masses. Bowel sounds are positive.  GENITAL: Exam is deferred.  EXTREMITIES: No significant edema, cyanosis or clubbing at this moment.  MUSCULOSKELETAL: No significant joint effusion or joint swelling.  NEUROLOGIC: Cranial nerves II through XII grossly intact.  Strength is equal in all 4 extremities, 5 out of 5.  MOOD: The patient is a very poor historian but he is overall alert and oriented x 3 and cooperative.  LYMPHATIC: Negative for lymphadenopathy in the neck or supraclavicular areas.  SKIN: No rashes, petechiae or any new lesions.   LABORATORY, DIAGNOSTIC AND RADIOLOGICAL DATA:   1.  Glucose is 105, BUN 21, creatinine 1.83, potassium 3.2. GFR is around 41. Troponin is 0.03.  White blood count is 10.9, hemoglobin is 13, platelet count 157.  2.  Chest x-ray: Opacification of the periareolar region and left mid to lower lung likely due to infection. Cannot exclude asymmetric edema.  3.  EKG: Atrial fibrillation, heart rate around 100, no ST depression or elevation.   ASSESSMENT AND PLAN: A 54 year old gentleman with history of ischemic cardiomyopathy, chronic kidney disease, chronic respiratory failure, chronic obstructive pulmonary disease, 2 liters of oxygen, hypertension with multiple hospitalizations this year, comes with increased shortness of breath, cough and phlegm, what appears to be pneumonia.  1.  Pneumonia. This is on the category of healthcare-acquired pneumonia as the patient has  been in and out of the hospital on multiple occasions. The patient has blood cultures. Broad-spectrum antibiotics are started with vancomycin, Zosyn and Levaquin. Consider stopping vancomycin in the next 24 to 48 hours if blood cultures and sputum cultures are negative. Sputum cultures sent. Blood culture sent. Legionella and Streptococcosis antigen as well and pulmonary toilet.  2.  As far as respiratory failure. The patient has chronic respiratory failure. At this moment, has worsening of his shortness of breath with respiratory rate in the 30s with use of accessory muscles earlier, significant distress. His oxygen saturation has been around 93%. Acute on chronic respiratory failure. This is likely secondary to pneumonia, but could be also underlying congestive heart  failure.  3.  Ischemic cardiomyopathy and congestive heart failure with systolic dysfunction, ejection fraction of about 15% to 20%. The patient is a poor candidate for automatic implantable cardiac defibrillator. Patient is a poor candidate for anticoagulation with his atrial fibrillation due to noncompliance. The patient will have his Lasix p.o. At this moment, I do not see any signs of acute exacerbation of his congestive heart failure, seems more likely pneumonia.  4.  Hypertension. Continue treatment with current medications.  5.  Hypokalemia. Replace with oral potassium.  6.  Chronic kidney disease. This is stable with a creatinine of 1.83 which is around his baseline. 7.  Slight increase in the white blood cells. This is likely secondary to pneumonia. We will continue to follow as it is only 10.9 today.  8.  Systemic inflammatory response syndrome/sepsis. The patient has pneumonia. He is tachypneic at 32 with a heart rate up to 102, meeting criteria for sepsis. The patient is already on antibiotics with blood cultures pending to be reported.  9.  Deep vein thrombosis prophylaxis with heparin.  10.  Gastrointestinal prophylaxis with Protonix.   TIME SPENT:  I spent about 50 minutes with this patient.    ____________________________ Felipa Furnaceoberto Sanchez Gutierrez, MD rsg:cs D: 06/25/2013 13:11:01 ET T: 06/25/2013 14:38:11 ET JOB#: [id number]  cc: Felipa Furnaceoberto Sanchez Gutierrez, MD, <Dictator> Fe Okubo Juanda ChanceSANCHEZ GUTIERRE MD ELECTRONICALLY SIGNED 06/25/2013 18:28

## 2014-05-12 NOTE — H&P (Signed)
PATIENT NAME:  Drew Lynch, Drew Lynch MR#:  [id number] DATE OF BIRTH:  10-26-1960  DATE OF ADMISSION:  01/12/2013  PRIMARY CARE PHYSICIAN: [name], MD  CHIEF COMPLAINT: Chest pain and shortness of breath.   HISTORY OF PRESENT ILLNESS: This is a 54 year old male who presents to the hospital with shortness of breath and chest pain that began earlier this morning. The patient was just recently discharged from the hospital about a week or so ago with CHF and COPD. The patient was discharged on some Zithromax and also on a prednisone taper. He had been doing well until the past few days when he has been having more exertional shortness of breath and developed chest pain this morning and therefore got a bit worried and came to the ER for further evaluation.   Clinically, the patient was thought to be in acute-on-chronic systolic CHF, and hospitalist services were contacted for further treatment and evaluation.   REVIEW OF SYSTEMS: CONSTITUTIONAL: No documented fever. Positive fatigue. No weight gain or weight loss.  EYES: No blurred or double vision.  ENT: No tinnitus. No postnasal drip. No redness of the oropharynx.  RESPIRATORY: Positive cough. No wheeze, no hemoptysis. Positive dyspnea.  CARDIOVASCULAR: Positive chest pain. No orthopnea, no palpitations, no syncope.  GASTROINTESTINAL: No nausea, no vomiting, no diarrhea. No abdominal pain. No melena or hematochezia.  GENITOURINARY: No dysuria or hematuria.  ENDOCRINE: No polyuria or nocturia. No heat or cold intolerance.  HEMATOLOGIC: No anemia. No bruising. No bleeding.  INTEGUMENTARY: No rashes. No lesions.  MUSCULOSKELETAL: No arthritis. No swelling. No gout.  NEUROLOGIC: No numbness or tingling. No ataxia. No seizure-type activity.  PSYCHIATRIC: No anxiety. No insomnia. No ADD.   PAST MEDICAL HISTORY: Consistent with cardiomyopathy, ejection fraction of 25%;  history of chronic kidney disease, stage III; history of COPD on chronic oxygen;  hypertension; chronic atrial fibrillation/flutter.   ALLERGIES: No known drug allergies.   SOCIAL HISTORY: Used to be a smoker, quit 20 years ago. No alcohol abuse. No illicit drug abuse. Lives with a friend.   FAMILY HISTORY: Positive for cancer and coronary disease.   CURRENT MEDICATIONS: As follows: Advair 250/50 one puff b.i.d., aspirin 81 mg daily, Cardizem CD 120 mg daily, Lasix 40 mg daily, potassium 10 mEq b.i.d., quinapril 20 mg daily, Spiriva 1 puff daily.   PHYSICAL EXAMINATION:  VITAL SIGNS: Temperature is 97.6, pulse 84, respirations 22, blood pressure 135/95, sats 92% on 2 liters nasal cannula.  GENERAL: A very unkempt-appearing male in mild respiratory distress.  HEENT: Atraumatic, normocephalic. His extraocular muscles are intact. His pupils are reactive to light. Sclerae anicteric. No conjunctival injection. No pharyngeal erythema.  NECK: Supple. There is no jugular venous distention. No bruits. No lymphadenopathy or thyromegaly.  HEART: Irregular. No murmurs. No rubs. No clicks.  LUNGS: Prolonged inspiratory and expiratory phases, bibasilar crackles. No wheezing, no rhonchi.  ABDOMEN: Soft, flat, nontender, nondistended. Has good bowel sounds. No hepatosplenomegaly appreciated.  EXTREMITIES: No evidence of any cyanosis, clubbing, or peripheral edema. Has +2 pedal and radial pulses bilaterally.  NEUROLOGIC: Alert, awake, and oriented x3 with no focal motor or sensory deficits appreciated bilaterally.  SKIN: Moist and warm with no rashes appreciated.  LYMPHATIC: No cervical or axillary lymphadenopathy.   LABORATORY, DIAGNOSTIC AND RADIOLOGIC DATA: Serum glucose 81, BUN 25, creatinine 1.8, sodium 141, potassium 4, chloride 110, bicarb 25. BNP is 33,000. Troponin 0.03. White cell count 7.1, hemoglobin 11.6, hematocrit 37.5, platelet count of 229.   The patient did have a  chest x-ray done which showed cardiomegaly with mild edema pattern and small right effusion, likely CHF.    ASSESSMENT AND PLAN: This is a 54 year old male with history of chronic obstructive pulmonary disease; hypertension; history of cardiomyopathy, ejection fraction of 25%;  chronic kidney disease, stage III; history of chronic atrial fibrillation/flutter; medical noncompliance who presents to the hospital due to chest pain and shortness of breath.  1.  Acute congestive heart failure, likely the cause of the patient's shortness of breath. This is likely acute-on-chronic systolic dysfunction. The patient has an ejection fraction of 25%. I will diurese him with IV Lasix, follow ins and outs and daily weights. He apparently had developed some bradycardia with beta blockers in the past, but his heart rate seems to be pretty good at this time. I will start some low-dose Coreg, continue his ACE inhibitor.  2.  Atrial fibrillation/flutter. This is chronic for the patient. His rates are currently stable. I will continue his Cardizem. I will add some low-dose Coreg given his cardiomyopathy. The patient is not on long-term anticoagulation given compliance issues.  3.  Chronic kidney disease, stage III. His creatinine currently is at baseline. I will follow his BUN and creatinine. Will diurese with Lasix.  4.  Chronic obstructive pulmonary disease. No evidence of chronic obstructive pulmonary disease exacerbation. Continue his Advair and Spiriva. Will also place on p.r.n. DuoNebs.  5.  Chest pain. This is probably related to his underlying chronic obstructive pulmonary disease. No evidence of acute coronary syndrome. I will observe him on telemetry. Follow serial cardiac markers. Continue aspirin, beta blocker and his statin for now.   CODE STATUS: FULL CODE.   TIME SPENT WITH ADMISSION: 50 minutes.   ____________________________ Rolly Pancake. Cherlynn Kaiser, MD vjs:np D: 01/12/2013 17:38:10 ET T: 01/12/2013 18:30:27 ET JOB#: [id number]  cc: Rolly Pancake. Cherlynn Kaiser, MD, <Dictator> Houston Siren MD ELECTRONICALLY SIGNED  02/15/2013 11:15

## 2014-05-12 NOTE — Discharge Summary (Signed)
PATIENT NAME:  Drew Lynch, Drew Lynch MR#:  [id number] DATE OF BIRTH:  12/26/1960  DATE OF ADMISSION:  02/20/2013 DATE OF DISCHARGE:  02/23/2013  ADMISSION DIAGNOSES: 1.  Acute on chronic respiratory failure.  2.  Acute systolic heart failure.  DISCHARGE DIAGNOSES: 1.  Acute on chronic respiratory failure, combination of chronic obstructive pulmonary disease flare and acute on chronic systolic heart failure.  2.  Acute systolic heart failure, ejection fraction of 25%.  3.  History of atrial fibrillation and atrial flutter.  4.  Chronic kidney disease. 5.  Chronic obstructive pulmonary disease with acute exacerbation. 6.  Chest pain, chronic.   CONSULTATIONS: Dr. Juliann Paresallwood  LABORATORIES AT DISCHARGE:  Sodium 135, potassium 3.9, chloride 100, bicarb 28, BUN 64, creatinine 2.41, glucose is 185.   HOSPITAL COURSE: A 54 year old male with known history of atrial fibrillation/atrial flutter, cardiomyopathy, EF of 25%, who presents with acute on chronic respiratory failure. For further details, please refer to the H and P.  1.  Acute on chronic respiratory failure combination of chronic obstructive pulmonary disease and acute congestive heart failure. The patient was admitted to telemetry. His troponins were trended. He had a cardiology consult. He was placed on IV Lasix and antibiotics, steroids and oxygen for his chronic obstructive pulmonary disease flare. He is actually doing quite well from a respiratory standpoint.  2.  Acute systolic heart failure, ejection fraction of 25%. The patient is now actually hypovolemic. His creatinine did bump up a little. We actually diuresed him about 5.5 liters. Cardiology is consulted. We encouraged the patient to drink about a liter and a half of fluids today. [facility] will see [facility] for a repeat creatinine and we are holding his Lasix until further seen by his primary care physician.  3.  Atrial fibrillation/atrial flutter. His rate was controlled.  He is not on anticoagulation due to medication noncompliance and poor understanding of his condition. He will continue on Cardizem and Coreg. 4.  Chronic kidney disease stage III. The patient is slightly over diuresed with about 5.7 liters taken out during this hospitalization. We encourage a liter and a half of fluid intake during the day-to-day and have a repeat BMP in the a.m. with [facility]. He also did follow up with [facility]. We are holding lisinopril and Lasix. 5.  Chronic obstructive pulmonary disease. The patient has acute exacerbation flare. The patient will continue on p.o. steroids, as his lung sounds are clear to auscultation without any crackles or wheezing today. He will continue his outpatient medications.   DISCHARGE MEDICATIONS: 1.  Diltiazem 120 mg daily.  2.  Aspirin 81 mg daily.  3.  Spiriva 18 mcg daily. 4.  Coreg 3.125 b.i.d.  5.  Combivent 1 puff 4 times a day as needed.  6.  Ferrous sulfate 325 b.i.d.  7.  Vitamin D 50,000 international units a month.  8.  Omeprazole 20 mg b.i.d.  9.  Allopurinol 100 mg daily.  10.  Nitroglycerin sublingual p.r.n. chest pain.  11.  ProAir 2 puffs 4 times a day.  12.  Mapap Arthritis 650 mg q. 6 hours p.r.n.  13.  Prednisone taper starting at 60 mg, taper x 10 mg every day.  14.  Advair Diskus 250/50 b.i.d.  15.  Levaquin 750 mg q. 48 hours x 2 days. Marland Kitchen.  DISCHARGE OXYGEN:  2 liters nasal cannula.   DISCHARGE DIET: Low sodium.   DISCHARGE ACTIVITY: As tolerated.  DISCHARGE FOLLOWUP:  The patient has  a followup tomorrow at [facility].  At that time he needs a repeat BMP.   TIME SPENT: Approximately 35 minutes. The patient is medically stable for discharge.   ____________________________ Neylan Koroma P. Juliene Pina, MD spm:ce D: 02/23/2013 14:14:19 ET T: 02/23/2013 14:53:38 ET JOB#: [id number]  cc: Barabara Motz P. Juliene Pina, MD, <Dictator> Janyth Contes Yamili Lichtenwalner MD ELECTRONICALLY SIGNED 02/23/2013 21:33

## 2014-05-12 NOTE — Consult Note (Signed)
Chief Complaint:  Subjective/Chief Complaint Pt states to be doing ok. Denies cp He c/o  of improved sob.   VITAL SIGNS/ANCILLARY NOTES: **Vital Signs.:   04-Feb-15 11:37  Vital Signs Type Routine  Temperature Temperature (F) 97.5  Celsius 36.3  Temperature Source oral  Pulse Pulse 83  Respirations Respirations 18  Systolic BP Systolic BP 027  Diastolic BP (mmHg) Diastolic BP (mmHg) 57  Mean BP 75  Pulse Ox % Pulse Ox % 96  Pulse Ox Activity Level  At rest  Oxygen Delivery 2L  *Intake and Output.:   Shift 04-Feb-15 15:00  Grand Totals Intake:  480 Output:  1450    Net:  -970 24 Hr.:  -970  Oral Intake      In:  480  Urine ml     Out:  1450  Length of Stay Totals Intake:  1437 Output:  7412    Net:  -5093   Brief Assessment:  GEN well developed, well nourished, no acute distress   Cardiac Irregular  -- thrills  -- LE edema   Respiratory normal resp effort   Gastrointestinal Normal   Gastrointestinal details normal Soft  Nontender  Nondistended   Lab Results: Routine Chem:  02-Feb-15 11:20   Glucose, Serum 86  BUN  47  Creatinine (comp)  2.18  Sodium, Serum 138  Chloride, Serum  108  CO2, Serum 28  Calcium (Total), Serum 9.0  Anion Gap  2  Osmolality (calc) 287  eGFR (African American)  38  eGFR (Non-African American)  33 (eGFR values <93m/min/1.73 m2 may be an indication of chronic kidney disease (CKD). Calculated eGFR is useful in patients with stable renal function. The eGFR calculation will not be reliable in acutely ill patients when serum creatinine is changing rapidly. It is not useful in  patients on dialysis. The eGFR calculation may not be applicable to patients at the low and high extremes of body sizes, pregnant women, and vegetarians.)  B-Type Natriuretic Peptide ([facility]  [id number](Result(s) reported on 20 Feb 2013 at 01:29PM.)  03-Feb-15 04:31   Glucose, Serum  132  BUN  42  Creatinine (comp)  1.99  Sodium, Serum 138  Potassium, Serum  4.4  Chloride, Serum 107  CO2, Serum 29  Calcium (Total), Serum 8.8  Anion Gap  2  Osmolality (calc) 288  eGFR (African American)  43  eGFR (Non-African American)  37 (eGFR values <660mmin/1.73 m2 may be an indication of chronic kidney disease (CKD). Calculated eGFR is useful in patients with stable renal function. The eGFR calculation will not be reliable in acutely ill patients when serum creatinine is changing rapidly. It is not useful in  patients on dialysis. The eGFR calculation may not be applicable to patients at the low and high extremes of body sizes, pregnant women, and vegetarians.)  Cardiac:  02-Feb-15 11:20   CK, Total 177  CPK-MB, Serum  5.7 (Result(s) reported on 20 Feb 2013 at 01:29PM.)  Troponin I 0.02 (0.00-0.05 0.05 ng/mL or less: NEGATIVE  Repeat testing in 3-6 hrs  if clinically indicated. >0.05 ng/mL: POTENTIAL  MYOCARDIAL INJURY. Repeat  testing in 3-6 hrs if  clinically indicated. NOTE: An increase or decrease  of 30% or more on serial  testing suggests a  clinically important change)  Routine Hem:  02-Feb-15 11:20   WBC (CBC) 9.9  RBC (CBC) 5.89  Hemoglobin (CBC) 14.2  Hematocrit (CBC) 45.7  Platelet Count (CBC)  148 (Result(s) reported on 20 Feb 2013 at 11:58AM.)  MCV  78  MCH  24.1  MCHC  31.0  RDW  22.0  03-Feb-15 04:31   WBC (CBC) 5.5  RBC (CBC) 5.65  Hemoglobin (CBC) 13.7  Hematocrit (CBC) 43.2  Platelet Count (CBC) 153  MCV  76  MCH  24.2  MCHC  31.7  RDW  21.8  Neutrophil % 90.2  Lymphocyte % 4.8  Monocyte % 4.8  Eosinophil % 0.0  Basophil % 0.2  Neutrophil # 5.0  Lymphocyte #  0.3  Monocyte # 0.3  Eosinophil # 0.0  Basophil # 0.0 (Result(s) reported on 21 Feb 2013 at 05:36AM.)   Radiology Results: XRay:    02-Feb-15 11:41, Chest PA and Lateral  Chest PA and Lateral   REASON FOR EXAM:    Shortness of breath  COMMENTS:       PROCEDURE: DXR - DXR CHEST PA (OR AP) AND LATERAL  - Feb 20 2013 11:41AM     CLINICAL  DATA:  Shortness of breath with wheezing and cough.    EXAM:  CHEST  2 VIEW    COMPARISON:  01/14/2013    FINDINGS:  Stable asymmetric elevation of the right hemidiaphragm. Small right  pleural effusion again noted. Aeration in the bases is slightly  improved in the interval. Vascular congestion continues to improve  as well. Right hilar fullness is stable. The cardio pericardial  silhouette is enlarged.     IMPRESSION:  Slight interval improvement in aeration.    Cardiomegaly with vascular congestion and stable right hilar  fullness.      Electronically Signed    By: Misty Stanley M.D.    On: 02/20/2013 11:47     Verified By: ERIC A. MANSELL, M.D.,  Cardiology:    02-Feb-15 11:19, ED ECG  Ventricular Rate 87  Atrial Rate 82  QRS Duration 110  QT 360  QTc 433  R Axis -30  T Axis 127  ECG interpretation   Atrial fibrillation  Left axis deviation  Septal infarct (cited on or before 12-Jan-2013)  ST & T wave abnormality, consider lateral ischemia  Abnormal ECG  When compared with ECG of 12-Jan-2013 14:39,  Atrial fibrillation has replaced Atrial flutter  T wave inversion no longer evident in Anterior leads  ----------unconfirmed----------  Confirmed by OVERREAD, NOT ([id number]), editor PEARSON, BARBARA ([id number]) on 02/21/2013 9:31:58 AM  ED ECG    Assessment/Plan:  Invasive Device Daily Assessment of Necessity:  Does the patient currently have any of the following indwelling devices? none   Assessment/Plan:  Assessment IMP Resp Failure Acute /Chronic CHF CM AFIB HTN Hyperlipidemia AFIB .   Plan PLAN Tele ACE/B-blocker Bp control No anticoug/Non compliance Rate conrtol for AFIB Lasix for sob chf Please continue statin Rec PT/OT Inhalers prn   Electronic Signatures: Lujean Amel D (MD)  (Signed 04-Feb-15 22:09)  Authored: Chief Complaint, VITAL SIGNS/ANCILLARY NOTES, Brief Assessment, Lab Results, Radiology Results, Assessment/Plan   Last Updated:  04-Feb-15 22:09 by Lujean Amel D (MD)

## 2014-05-12 NOTE — H&P (Signed)
PATIENT NAME:  Drew Lynch, DOBOSZ MR#:  [id number] DATE OF BIRTH:  Oct 05, 1960  DATE OF ADMISSION:  02/20/2013  PRIMARY CARE PHYSICIAN: [facility].   EMERGENCY DEPARTMENT REFERRING PHYSICIAN: Dr. Sharma Covert  CHIEF COMPLAINT: Shortness of breath, cough.   HISTORY OF PRESENT ILLNESS: The patient is a 54 year old African American male with history of COPD, also has a history of systolic CHF who has had recurrent admissions to the hospital. He usually comes in every few weeks with similar complaints. He has been having shortness of breath, cough. Was seen as outpatient. Was treated with some prednisone without improvement. Comes to the ED with worsening symptoms. He is thought to have acute COPD flare as well as acute pneumonia. The patient also reports that he has been having some left shoulder pain and left-sided chest pain. Denies any fevers or chills. Complains of cough which has been productive of yellow sputum. Denies any lower extremity swelling or nocturnal dyspnea.   PAST MEDICAL HISTORY: Significant for:  1.  Cardiomyopathy with the last EF in December of 15% to 20%.  2.  Chronic kidney disease, stage III.  3.  COPD, on chronic oxygen at 2 liters at home.  4.  Hypertension.  5.  Chronic A-fib/flutter. He is not a candidate for any type of anticoagulation.  6.  Seizure disorder.  7.  GERD.  PAST SURGICAL HISTORY:  1.  Status post hernia surgery. 2.  Laser surgery to the eyes.   ALLERGIES: No known drug allergies.   CURRENT MEDICATIONS: Advair 250/50 one puff b.i.d., allopurinol 100 daily, aspirin 81 mg 1 tab p.o. daily, Coreg 3.125 one tab p.o. b.i.d., Combivent 1 puff 4 times a day as needed, diltiazem 120 daily, iron sulfate 325 mg 1 tab p.o. b.i.d., Lasix 40 one 1 tab p.o. b.i.d., Nitrostat 0.4 sublingual p.r.n. q. 5 minutes as needed, omeprazole 20 one 1 tab p.o. b.i.d., KCl 10 mEq 1 tab p.o. b.i.d., prednisone taper, Pro Air 2 puffs 4 times a day as needed, quinapril 20 daily,  Spiriva 18 mcg daily, vitamin D2 50,000 international units once a month.   SOCIAL HISTORY: Smoking, quit 20 years ago. No alcohol or drug use.   FAMILY HISTORY: Positive for cancer and coronary artery disease.   REVIEW OF SYSTEMS: CONSTITUTIONAL: Denies any fevers. Complains of fatigue. No weight gain or weight loss.  EYES: No blurred or double vision. He has right eye blindness.  ENT: No tinnitus. No nasal drip. No redness of the oropharynx.  LUNGS: Complains of cough. He is complaining of wheezing. No hemoptysis. Complains of dyspnea.  CARDIOVASCULAR: Complains of chest pain. No orthopnea. No palpitation. No syncope.  GASTROINTESTINAL: No nausea, vomiting, or diarrhea. No abdominal pain. No hematemesis. No melena.  GENITOURINARY: Denies any dysuria, hematuria, renal calculus, or frequency.  ENDOCRINE: Denies any polyuria, nocturia. No heat or cold intolerance.  HEMATOLOGIC: Denies anemia, easy bruisability or bleeding.  SKIN: No rashes. No lesions.  MUSCULOSKELETAL: No arthritis.  NEUROLOGIC: No numbness, tingling. No ataxia.  PSYCHIATRIC: No anxiety. No insomnia. No ADD.  PHYSICAL EXAMINATION: VITAL SIGNS: Temperature 98.4, pulse 97, respirations 22, blood pressure 107/77, O2 95%.  GENERAL: The patient is a thin African American male. Appears chronically ill. Mild respiratory distress.  HEENT: Atraumatic, normocephalic. His extraocular movements are intact. He has clouding of the right pupil. Sclera is anicteric. No conjunctival erythema. Oropharynx is clear without any exudate.  NECK: Supple. There is no JVD.  HEART: Irregularly irregular. No murmurs, rubs, clicks, or gallops.  LUNGS: He has expiratory wheezes. He has bilateral crackles. Some accessory muscle usage.  ABDOMEN: Soft, nontender, nondistended. Positive bowel sounds x4.  EXTREMITIES: No clubbing, cyanosis, or edema.  SKIN: No rash.  LYMPHATICS: No lymph nodes palpable.  VASCULAR: Good DP and PT pulses.  PSYCHIATRIC:  Not anxious or depressed. Awake, alert, and oriented x3. No focal deficits. SKIN: There is no rash.  LYMPHATICS: No lymph nodes palpable.  LABORATORY AND DIAGNOSTICS: Chest x-ray shows slight interval improvement in aeration, cardiomegaly with vascular congestion and stable right hilar fullness.  Glucose 86, BUN 47, creatinine 2.18, sodium 138, potassium 4.1, chloride 108, CO2 28, and calcium 9. CPK 177, CK-MB 5.7. Troponin 0.02. WBC 9.9, hemoglobin 14.2, platelet count 148.  ABG with pH of 7.39, pCO2 of 41, and PO2 of 65.   Chest x-ray shows improvement in the aeration of the lungs.   ASSESSMENT AND PLAN: The patient is a 54 year old with recurrent admissions for similar presentation, presents with shortness of breath, cough.  1.  Acute on chronic respiratory failure, likely due to a combination of chronic obstructive pulmonary disease flare and acute congestive heart failure exacerbation. For his acute systolic congestive heart failure, we will treat him with IV Lasix. Follow his ins and outs, follow his respiratory status. In light of him having an echocardiogram done recently we will not repeat the echocardiogram.  2.  Atrial fibrillation/atrial flutter, currently rate control. Continue Cardizem and Coreg. He is not on anticoagulation due to medication noncompliance and poor understanding of his condition.  3.  Chronic kidney disease, stage III. Creatinine at baseline. Will follow.  4.  Chronic obstructive pulmonary disease with acute exacerbation. Will treat with nebulizers and steroids. Continue Spiriva and Advair. I will add Mucinex to his treatment as well.  5.  Chest pain, history of same in the past. Follow cardiac enzymes. We will ask cardiology to see for any further recommendations.  TIME SPENT ON THIS PATIENT: 50 minutes. ____________________________ Lacie ScottsShreyang H. Allena KatzPatel, MD shp:sb D: 02/20/2013 16:21:26 ET T: 02/20/2013 16:43:05 ET JOB#: [id number]  cc: Perle Gibbon H. Allena KatzPatel, MD,  <Dictator> Charise CarwinSHREYANG H Zella Dewan MD ELECTRONICALLY SIGNED 03/05/2013 13:10

## 2014-05-12 NOTE — Consult Note (Signed)
PATIENT NAME:  Drew Lynch, Drew Lynch MR#:  [id number] DATE OF BIRTH:  10/02/1960  DATE OF CONSULTATION:  04/28/2013  REFERRING PHYSICIAN:  Herschell Dimes. Renae Gloss, MD CONSULTING PHYSICIAN:  Lamar Blinks, MD  REASON FOR CONSULTATION: Acute respiratory failure with known dilated cardiomyopathy, intermittent atrial fibrillation with rapid ventricular rate.   CHIEF COMPLAINT: "I'm short of breath."   HISTORY OF PRESENT ILLNESS: This is a 54 year old male with severe dilated cardiomyopathy and previous LV systolic dysfunction, with consistent stable New York Heart Association II to III congestive heart failure-type symptoms, having an acute respiratory issue and infection, now causing atrial fibrillation with rapid ventricular rate. The patient does have a CHADS score of 2 and should be anticoagulated with his atrial fibrillation. His rapid ventricular rate is now being treated with Cardizem with better heart rate control. He has had some lower extremity edema, for which Lasix has helped a great deal over the last several hours. The patient is more comfortable with expiratory wheezing. Troponin, CK-MB shows a troponin of 0.1 and  of 2.1 consistent with demand ischemia.   REVIEW OF SYSTEMS: Remainder negative for vision change, ringing in the ears, hearing loss, cough, congestion, heartburn, nausea, vomiting, diarrhea, bloody stools, stomach pain, extremity pain, leg weakness, cramping of the buttocks, known blood clots, headaches, blackouts, dizzy spells, nosebleeds, congestion, trouble swallowing, frequent urination, urination at night, muscle weakness, numbness, anxiety, depression, skin lesions or skin rashes.   PAST MEDICAL HISTORY: 1.  Atrial fibrillation.  2.  Cardiomyopathy.  3.  COPD.  4.  Gastroesophageal reflux.  5.  Hyperlipidemia. 6.  Hypertension.   FAMILY HISTORY: No family members with early onset of cardiovascular disease, other than diabetes and hypertension.   SOCIAL HISTORY: Smoked  over 20 years and quit in the remote past. Denies alcohol use.   ALLERGIES: AS LISTED.   MEDICATIONS: As listed.   PHYSICAL EXAMINATION: VITAL SIGNS: Blood pressure is 100/60 bilaterally. Heart rate is 105 upright, reclining, and irregular.  GENERAL: He is a well appearing male in no acute distress.  HEAD, EYES, EARS, NOSE, AND THROAT: No icterus, thyromegaly, ulcers, hemorrhage, or xanthelasma.  CARDIOVASCULAR: Irregularly irregular with normal S1 and S2; a 2/6 apical murmur consistent with mitral regurgitation. PMI is diffuse. Carotid upstroke normal without bruit. Jugular venous pressure is normal.  LUNGS: Have expiratory wheezes and basilar crackles.  ABDOMEN: Soft, nontender, without hepatosplenomegaly or masses. Abdominal aorta is normal size without bruit.  EXTREMITIES: Show 2+ radial, femoral, dorsal pedal pulses, with trace lower extremity edema. No cyanosis, clubbing, or ulcers.  NEUROLOGIC: He is oriented to time, place, and person, with normal mood and affect.   ASSESSMENT: A 54 year old male with acute atrial fibrillation with rapid ventricular rate secondary to acute respiratory failure and chronic systolic dysfunction, congestive heart failure with elevated troponin consistent with elevated troponin and demand ischemia, with chronic kidney disease.   RECOMMENDATIONS: 1.  Continue physical activity and ambulation,  following for adjustments and need in adjustments of medication management for heart rate control.  2.  Continue serial ECG and enzymes to assess for possible myocardial infarction.  3.  Anticoagulation if able for further risk reduction in stroke with atrial fibrillation if not contraindicated. 4.  Follow closely for any worsening hypoxia and need for Lasix for pulmonary edema with acute on chronic systolic dysfunction and congestive heart failure.  5.  Oxygenation for low oxygen saturations and pneumonia.  6.  Further treatment options after ambulation.    ____________________________ Lamar Blinks, MD  bjk:jcm D: 04/28/2013 14:44:47 ET T: 04/28/2013 15:18:28 ET JOB#: [id number]  cc: Lamar BlinksBruce J. Ipek Westra, MD, <Dictator> Lamar BlinksBRUCE J Dyson Sevey MD ELECTRONICALLY SIGNED 05/02/2013 7:50

## 2014-05-12 NOTE — Consult Note (Signed)
PATIENT NAME:  Drew Lynch, [name] MR#:  [id number] DATE OF BIRTH:  1960-09-23  DATE OF CONSULTATION:  02/21/2013  REFERRING PHYSICIAN:   CONSULTING PHYSICIAN:  Dwayne D. Callwood, MD  INDICATION: Shortness of breath, congestion, cough.  [facility]YSIC[facility]  HISTORY OF PRESENT ILLNESS: The patient is a 54 year old African American male with history of COPD who has a history of systolic dysfunction with EF about 25%. He has stage III kidney disease, hypertension, chronic atrial fib, seizure disorder, and GERD. He has been doing reasonably well. Came to the hospital. Treated with prednisone without significant improvement. Was brought back with worsening COPD flare, thought to be acute pneumonia. Left-sided shoulder pain, left-sided chest pain. Denies any fever, chills, sweats. Had some productive yellow sputum. Otherwise negative.   PAST MEDICAL HISTORY: Cardiomyopathy with EF about 15% to 20%, chronic renal insufficiency, COPD, hypertension, atrial fib, seizure disorder, and GERD.   PAST SURGICAL HISTORY: Status post hernia surgery and laser surgery and eye surgery.  ALLERGIES: No known drug allergies.   SOCIAL HISTORY: Quit smoking 20 years ago. Denies alcohol consumption. Disabled.   FAMILY HISTORY: Cancer, coronary artery disease.  REVIEW OF SYSTEMS: Just shortness of breath, cough, congestion, and occasional sputum production. No blackout spells or syncope.  MEDICATIONS: Advair 250/50 twice a day, allopurinol 100 mg a day, aspirin 81 mg a day, Coreg 3.125 twice a day, Combivent 1 puff 4 times a day, diltiazem 120 mg a day, iron 325 mg a day, Lasix 40 a day, Nitrostat sublingual p.r.n., omeprazole 20 a day, KCL 10 mEq a day, prednisone taper, Pro Air 2 puffs 4 times a day, quinapril 40 a day, Spiriva 10 mcg a day, vitamin D.   PHYSICAL EXAMINATION: VITAL SIGNS: Blood pressure was 110/70, pulse of 110 and irregular, respiratory rate 20, and afebrile.  HEENT:  Normocephalic, atraumatic. Pupils equal and reactive to light.  NECK: Supple. No significant JVD, bruits, or adenopathy.  LUNGS: Essentially clear to auscultation and percussion with bilateral rhonchi with mild rales in the bases. No wheezing. Adequate air movement.  HEART: Irregularly irregular. Systolic ejection murmur at the apex. Positive S3.  ABDOMEN: Benign.  EXTREMITIES: Within normal limits.  NEUROLOGIC: Intact.  SKIN: Normal.   LABORATORY AND DIAGNOSTICS: Chest x-ray: Cardiomegaly, vascular congestion. No significant infiltrates.  Glucose 86, BUN 47, creatinine 2.18, sodium 138, potassium 4.1, chloride 108, CO2 28, calcium 9. CPK 177. Troponin 0.02. White count 9.9, hemoglobin 14.2, and platelet count 148.   ABG: PH 7.39, pCO2 41, and pO2 65.   EKG: Atrial fibrillation, rate of about 95, nonspecific findings.   ASSESSMENT: 1.  Acute on chronic respiratory failure and congestive heart failure. 2.  Atrial fibrillation. 3.  Chronic renal insufficiency. 4.  Chronic obstructive pulmonary disease. 5.  Chest pain. 6.  Cardiomyopathy.   PLAN: 1.  Agree with admission. Agree with treatment for shortness of breath. Recommend treatment for chronic obstructive pulmonary disease and congestion with diuretics, inhalers, possible steroid therapy, and possible antibiotics. 2.  For atrial fibrillation, recommend rate control. Will increase Coreg dose. Maybe increase Cardizem dose as well. Consider adding amiodarone to help with rhythm. The patient should be on anticoagulation if he is able to tolerate it clinically. There is no evidence of bleeding. He has a significant enough CHADS score. Chest pain, probably noncardiac. We will follow up cardiac enzymes. We will probably in the end treat for noncardiac chest pain. Increase activity. Recommend have the patient follow up with nephrology for renal  insufficiency. We will continue to follow the patient. Echocardiogram may or may not be necessary at  this stage. Continue treatment for seizure disorder. Continue gastroesophageal reflux disease therapy. Continue chronic home oxygen if necessary.  ____________________________ Bobbie Stack Juliann Pares, MD ddc:sb D: 02/21/2013 10:35:47 ET T: 02/21/2013 11:25:36 ET JOB#: [id number]  cc: Dwayne D. Juliann Pares, MD, <Dictator> Alwyn Pea MD ELECTRONICALLY SIGNED 04/03/2013 7:48

## 2014-05-12 NOTE — H&P (Signed)
PATIENT NAME:  Drew Lynch, Drew Lynch MR#:  [id number] DATE OF BIRTH:  1960/03/11  DATE OF ADMISSION:  04/02/2013  REFERRING PHYSICIAN:  Eartha Inchory R. York CeriseForbach, MD  PRIMARY CARE PHYSICIAN:  [facility].    CHIEF COMPLAINT: Shortness of breath.   HISTORY OF PRESENT ILLNESS: The patient is a 54 year old male with chronic respiratory failure, chronic systolic CHF with EF of about 44%15% to 20%, cope, who is here for shortness of breath. Of note, the patient has had multiple recent hospitalizations. He was just discharged last month for acute on chronic respiratory failure. He states his shortness breath started 2 days ago. He has had chills overnight and also this morning without fevers. There is no sick contacts. He has a cough which is nonproductive and dry. He does not have any pains in the chest. EMS was called, who found the patient hypoxic; however, he was not on his oxygen. He is supposed to be on 2 liters. Oxygen was applied and sats have improved. Currently, his oxygen saturations were fine with his 2 liters. He has no fevers here, but x-ray of the chest shows right-sided pneumonia and hospitalist services were contacted for further evaluation and management.   PAST MEDICAL HISTORY: Cardiomyopathy with chronic systolic CHF, EF of 15 to 20%; CKD stage 3, chronic respiratory failure, COPD, hypertension, chronic A. fib/flutter with history of noncompliance and, therefore, not on any anticoagulation.   PAST SURGICAL HISTORY:  Hernia surgery, laser eye surgery.   ALLERGIES: No known drug allergies.   SOCIAL HISTORY: Quit smoking 20 years ago per chart. No alcohol or drug use. Lives with a friend.   FAMILY HISTORY: Cancer and CAD running in the family.   CURRENT MEDICATIONS: Advair 250/50 mcg 1 puff 2 times a day, allopurinol 100 mg once a day, aspirin 81 mg daily, Coreg 3.125 mg 2 times a day, diltiazem extended-release 120 mg once a day, ferrous sulfate 325 mg 2 times a day, furosemide 40 mg 2 times a  day, Mapap 1 tab 650 mg every 6 hours as needed for pain, Nitrostat p.r.n., omeprazole 20 mg 2 times a day, potassium chloride 10 mEq 2 times a day, ProAir p.r.n., quinapril 20 mg once a day, Spiriva 18 mcg daily inhaled 1 cap, vitamin D2, 50,000 units once a month.   REVIEW OF SYSTEMS: CONSTITUTIONAL: No fever but some chills. Also states that he has lost about 15 pounds in the last few months.   EYES: No blurry vision. 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  ENT: No tinnitus or hearing loss.  RESPIRATORY: Positive for a dry cough and some shortness of breath a couple of days, no dyspnea on exertion.  CARDIOVASCULAR: No chest pain or swelling in the legs. Occasional palpitations. No syncope.  GASTROINTESTINAL: No nausea, vomiting, diarrhea or abdominal pain. No black or tarry stools.  GENITOURINARY:  Denies increased frequency but states he is some dysuria.  HEMATOLOGIC AND LYMPHATIC: Denies anemia or easy bruising.  SKIN: No rashes.  MUSCULOSKELETAL: Has some arthritis.  NEUROLOGIC: No focal weakness or numbness.  PSYCHIATRIC: Denies anxiety or insomnia.   PHYSICAL EXAMINATION: VITAL SIGNS: Temperature on arrival 98.9, pulse rate 104, respiratory rate 20, blood pressure 126/109, O2 sat 89% on room air but he is supposed to be on oxygen 2 liters at all times. On oxygen his sats are 95% to 100%. GENERAL:  The patient is a well-developed male lying in bed, talking in full sentences.  HEENT:  Normocephalic, atraumatic.  Pupils  are equal and reactive. Anicteric sclerae. Moist mucous membranes.  NECK: Supple. No thyroid tenderness. No cervical lymphadenopathy. No JVD.  CARDIOVASCULAR: S1, S2, irregular.  No significant murmurs appreciated.  LUNGS: Right-sided decreased breath sounds at the base as well as some rales. No significant wheezing. Mild crackles in left base as well.  ABDOMEN: Soft, nontender, nondistended. Positive bowel sounds in all quadrants. Also an inguinal hernia on the right.   EXTREMITIES: No pitting edema. Some chronic venous stasis changes.  SKIN: No obvious rashes or lesions.  NEUROLOGIC: Cranial nerves II through XII grossly intact. Strength is 5 out of 5 in all extremities. Sensation is intact to light touch.  PSYCHIATRIC: Awake, alert, oriented, pleasant.   LABORATORY, DIAGNOSTIC AND RADIOLOGICAL DATA:  PH on the ABG shows 7.48, pCO2 36, pO2 88 on the 2 liters. White count 7.1, hemoglobin 12.9, platelets of 40.2. Troponin negative. Albumin is 2.9, otherwise LFTs within normal limits. Magnesium is 1.7. BNP is 32,288, slightly more than before. BUN 15, creatinine 1.79, sodium 143, potassium 3.7. EKG rate is 90, A. fib with some PACs, left axis deviation, nonspecific ST changes, slightly prolonged QT. X-ray of the chest right middle lobe infiltrate with associated effusion.   ASSESSMENT AND PLAN: We have a 54 year old with chronic respiratory failure on home O2, chronic systolic congestive heart failure, chronic obstructive pulmonary disease with shortness of breath for a couple of days with chills, nonproductive cough and noted to have right-sided pneumonia.  1.  In regards to the shortness of breath, I suspect pneumonia is more likely than congestive heart failure although he does have a very bad ejection fraction as well. He, of note, has had multiple recent hospitalizations and therefore will be treated with Levaquin, vancomycin and Zosyn, renally dosed. Would obtain sputum cultures, follow with blood cultures which have already been collected and check urine strep and Legionella antigens. Start oxygen at previous concentration and start his outpatient inhalers. He does not appear to have an acute chronic obstructive pulmonary disease flare. Although it is also possible he has acute on chronic systolic congestive heart failure, I suspect that is less likely as he does not endorse any orthopnea and has no significant lower extremity edema and does not have any particular  jugular venous distension. At this point, I would change the Lasix p.o. to IV Lasix in case the effusion  is congestive heart failure given the elvaated BNP but I suspect that is less likely. I would continue the Coreg and ACE inhibitor. He does not appear to be significantly short of breath at this time.  2.  In regards to his atrial fibrillation/flutter, it is rate-controlled and continue the diltiazem and low-dose beta blocker.  3.  We would start him on heparin for deep vein thrombosis prophylaxis. Continue his iron. His chronic kidney disease appears to be at baseline and is stage 3. The patient is full code.   TOTAL TIME SPENT: Is 50 minutes.   ____________________________ Krystal Eaton, MD sa:cs D: 04/02/2013 17:06:24 ET T: 04/02/2013 19:21:29 ET JOB#: [id number]  cc: Krystal Eaton, MD, <Dictator> [facility] Sarah "Maryruth Hancock, MD  Marcelle Smiling [facility] MD ELECTRONICALLY SIGNED 04/02/2013 04:54

## 2014-05-13 NOTE — Consult Note (Signed)
PATIENT NAME:  Drew Lynch, Drew Lynch MR#:  [id number] DATE OF BIRTH:  1961-01-09  DATE OF CONSULTATION:  04/08/2011  REFERRING PHYSICIAN:   CONSULTING PHYSICIAN:  Drew Lynch. Drew Tondreau, MD  SUBJECTIVE: This patient has acute on chronic congestive heart failure with improvements of shortness of breath, weakness and chronic atrial fibrillation with also improvement of headache after nitroglycerin changed.   PHYSICAL EXAMINATION:  VITAL SIGNS: Specifically as listed and unchanged from note from consultation.   Vital signs, laboratories, nurse's notes, x-rays, all reviewed.   ASSESSMENT: 54 year old with known significant severe dilated cardiomyopathy with acute on chronic congestive heart failure, chronic atrial fibrillation, hypertension, hyperlipidemia, chronic obstructive pulmonary disease exacerbation with hypoxia and renal insufficiency relatively controlled at this time and slowly improving.   PLAN:  1. Continue current medical regimen for severe dilated cardiomyopathy including carvedilol and ACE inhibitor.  2. Continue furosemide intravenously for severe pulmonary edema.  3. No further cardiac diagnostics necessary at this time. 4. Heart rate control as necessary with continued carvedilol use.  5. Ambulate and follow for any further significant symptoms requiring further intervention. 6. Further treatment options after above.   ____________________________ Drew Lynch. Drew Sharron, MD bjk:ap D: 04/09/2011 08:42:00 ET T: 04/09/2011 09:24:18 ET JOB#: [id number] cc: Drew Lynch. Drew Desautel, MD, <Dictator> Drew Lynch Drew Gillin MD ELECTRONICALLY SIGNED 04/09/2011 12:38

## 2014-05-13 NOTE — Consult Note (Signed)
General Aspect the patient is a 54 year old African American male with history of chronic atrial fibrillation and known severe dilated cardiomyopathy with an ejection fraction of 25-35% who presents to the emergency room with progressive shortness of breath chest pain and palpitations. Patient had been treated as an outpatient for his cardiomyopathy with beta blockers and diuresis. He was treated with carvedilol 3.125 mg twice daily, quinapril 40 mg b.i.d., aspirin and warfarin. In the emergency room he was noted to be in atrial fibrillation with increasing shortness of breath. Chest x-ray revealed cardiomegaly with probable moderate volume overload. He has improved with diuresis and currently is resting fairly comfortably in bed. He is a somewhat difficult historian but apparently has been compliant with his medications. He denies chest pain at present.   Physical Exam:   GEN thin    HEENT PERRL    NECK supple    RESP no use of accessory muscles  rhonchi  crackles    CARD Irregular rate and rhythm  Murmur    Murmur Systolic    Systolic Murmur axilla    ABD denies tenderness  normal BS    LYMPH negative neck    EXTR negative cyanosis/clubbing, positive edema    SKIN normal to palpation    NEURO cranial nerves intact, motor/sensory function intact    PSYCH A+O to time, place, person   Review of Systems:   General: Fatigue  Weakness    Skin: No Complaints    ENT: No Complaints    Eyes: No Complaints    Neck: No Complaints    Respiratory: Short of breath    Cardiovascular: Palpitations  Edema    Gastrointestinal: No Complaints    Genitourinary: No Complaints    Vascular: No Complaints    Musculoskeletal: No Complaints    Neurologic: No Complaints    Hematologic: No Complaints    Endocrine: No Complaints    Psychiatric: No Complaints    Review of Systems: All other systems were reviewed and found to be negative    Medications/Allergies Reviewed  Medications/Allergies reviewed        Admit Diagnosis:   CHF: 07-Apr-2011, Active, CHF      Admit Reason:   Congestive heart failure, unspecified: (428.0) 07-Apr-2011, Active, ICD9, Congestive heart failure, unspecified, Auto-generated by MLM Based on Admission Order  Home Medications: Medication Instructions Status  ProAir HFA 90 mcg/inh inhalation aerosol 2 puff(s) inhaled 4 times a day  Active  furosemide 40 mg oral tablet 1  orally once a day Active  K-Dur 10 oral tablet, extended release 1 tab(s) orally once a day Active  quinapril 40 mg oral tablet 1 tab(s) orally 2 times a day Active  Aspir 81 oral delayed release tablet 1 tab(s) orally once a day Active  omeprazole 20 mg oral delayed release capsule 1 cap(s) orally 2 times a day Active  Coreg 3.125 mg oral tablet 1 tab(s) orally 2 times a day Active  Advair Diskus 250 mcg-50 mcg inhalation powder 1 puff(s) inhaled 2 times a day Active  Lipitor 20 mg oral tablet 1 tab(s) orally once a day (at bedtime) Active  Tylenol 325 mg oral tablet 1 - 2 tab(s) orally every 4 - 6 hours, As Needed- for Pain  Active  albuterol 1 - 2 puff(s) inhaled every 4 - 6 hours, As Needed- for Shortness of Breath  Active   Routine Chem:  19-Mar-13 13:12    Anion Gap 13   EKG:   Interpretation atrial fibrillation  with variable ventricular response    No Known Allergies:     Impression 54 year old male with history of dilated cardiomyopathy with an ejection fraction 25-35%, history of atrial fibrillation who presents to the emergency room with progressive shortness of breath and fatigue. Chest x-ray reveals mild puluminary edema. Echocardiogram reveals no injury current. He is mild increase serum troponin at denied chest pain predominantly shortness of breath. patient reports compliance with his medications and diet. He had been started on warfarin as an outpatient for his atrial fibrillation. He is currently hemodynamically stable. Echocardiogram is  pending to evaluate for any for possible progression of his cardiomyopathy or valvular heart disease.    Plan 1. Continue with careful diuresis following renal function. Patient does have evidence of chronic kidney disease level III 2. Continue with rate control of his atrial fibrillation. Would consider restarting warfarin therapy with that he was on as an outpatient with a target INR of 2-3. 3. Continue with carvedilol 4. Continue to rule out for myocardial infarction 5. Further treatment based on patient's clinical course.   Electronic Signatures: Dalia Heading (MD)  (Signed 19-Mar-13 22:32)  Authored: General Aspect/Present Illness, History and Physical Exam, Review of System, Health Issues, Home Medications, Labs, EKG , Allergies, Impression/Plan   Last Updated: 19-Mar-13 22:32 by Dalia Heading (MD)

## 2014-05-13 NOTE — Discharge Summary (Signed)
PATIENT NAME:  Drew Lynch, Drew Lynch MR#:  [id number] DATE OF BIRTH:  1960/03/17  DATE OF ADMISSION:  04/07/2011 DATE OF DISCHARGE:  04/10/2011  CONSULTATION: Cardiology consult with Dr. Gwen Pounds.   DISCHARGE DIAGNOSES:  1. Acute on chronic systolic heart failure. 2. Chronic obstructive pulmonary disease. 3. Chronic kidney disease stage III.  4. Acute bronchitis.   DISCHARGE MEDICATIONS:  1. ProAir 90 mcg 2 puffs 4 times daily.  2. K-Dur 10 milliequivalents p.o. daily.  3. Aspirin 81 mg daily.  4. Omeprazole 20 mg p.o. b.i.d.  5. Coreg 3.125 p.o. b.i.d.  6. Advair Diskus 250/50, one puff b.i.d.  7. Lipitor 20 mg daily. 8. Tylenol as needed. 9. Albuterol 1 to 2 puffs or he can see use ProAir as well, either one.  10. Lasix 40 mg p.o. b.i.d.  11. Quinapril 20 mg p.o. b.i.d.  12. Aspirin 325 mg daily. 13. Levaquin 500 mg daily for two days.   DIET: Low sodium diet.   LABORATORY, RADIOLOGICAL AND DIAGNOSTIC DATA: BNP on admission 18,639. CK total 208, CPK-MB 7. WBC on admission 7.9, hemoglobin 13.9, hematocrit 43.5, platelets 220. Electrolytes: Sodium 146, potassium 4.3, chloride 108, bicarbonate 25, BUN 22, creatinine 1.94, glucose 85. Troponins 0.06. X-ray of the chest on admission showed elevated right hemidiaphragm consistent with some presumed right lung base pneumonia. Pulmonary vascular congestion and bronchitis. EKG showed atrial fibrillation at 74 beats per minute. TSH 1.09. Echocardiogram showed severe LV dysfunction and global hypokinesia with ejection fraction 15% with moderate four-chamber enlargement. Troponin second set is 0.06 with total of CPK-MB slightly elevated at 4.3, and third set also 0.06. Normal CK with the third set of 3.6. Repeat chest x-ray on 03/20 showed overall pulmonary edema but repeat x-ray again on the 22nd showed trace pleural effusion, mild cardiomegaly, but has no significant edema and also no focal pneumonia or pneumothorax. The patient's kidney function  stayed stable at BUN 27, creatinine 1.84, potassium was low at 3.3.   HOSPITAL COURSE:  1. Acute on chronic systolic heart failure. 54 year old male patient with history of chronic kidney disease. Also, systolic heart failure and dilated cardiomyopathy of 20%, chronic atrial fibrillation, chronic obstructive pulmonary disease, admitted for congestive heart failure exacerbation. The patient's BNP was elevated at 18,000. He is not hypoxic with sats 97 on room air and has elevated troponins, mild elevation. Admitted to hospitalist service, started on IV diuretics and also monitored. The patient has been followed with cardiology. Dr. Gwen Pounds saw the patient. The patient had good improvement in output. Chest x-ray also improved and he did not need any oxygen requirement and 02 sats were 96% on room air and blood pressure was 116/93. The patient was seen by cardiology on a regular basis and Dr. Gwen Pounds already saw the patient. He had a good diuresis and was ready to be discharged, so we discharged him with Lasix to continue and can follow up with cardiology regarding possible automatic implantable cardiac defibrillator placement.  2. Chronic atrial fibrillation. He was continued on aspirin 320 mg daily. The patient needs to follow-up with cardiology as an outpatient regarding starting Coumadin.  3. Bronchitis. He had some cough and also started on Levaquin. The patient was discharged home with finishing course of Levaquin.  4. Chronic kidney disease. The patient was getting quinapril 40 mg b.i.d., but I decreased the dose to 20 b.i.d. because of his kidney problem. He has chronic kidney disease, but his creatinine is stable and he has to follow-up with nephrology. He says  he does follow up with them. The patient's primary doctor is Dr. [name] and the patient has an appointment with her and also Dr. Gwen PoundsKowalski. He was discharged home with home health nursing and physical therapy.   CONDITION ON DISCHARGE:  Stable.         TOTAL TIME SPENT ON DISCHARGE PREPARATION: More than 30 minutes.  ____________________________ Drew HammingSnehalatha Dillie Burandt, MD sk:ap D: 04/16/2011 07:52:15 ET                T: 04/16/2011 13:59:33 ET                  JOB#: [id number]  cc: Drew HammingSnehalatha Cashmere Harmes, MD, <Dictator> Sarah "Sallie" Allena KatzPatel, MD Drew HammingSNEHALATHA Dai Mcadams MD ELECTRONICALLY SIGNED 04/17/2011 19:07

## 2014-05-13 NOTE — H&P (Signed)
PATIENT NAME:  Drew Lynch, Drew Lynch MR#:  [id number] DATE OF BIRTH:  [birth year]  DATE OF ADMISSION:  04/07/2011  PRIMARY CARE PHYSICIAN: [facility]   REQUESTING PHYSICIAN: Dr. Buford DresserFinnell   CHIEF COMPLAINT: Shortness of breath.   HISTORY OF PRESENT ILLNESS: The patient is a 54 year old male with a known history of heart failure with ejection fraction of 20%, CKD stage II to III, chronic atrial fibrillation, and chronic obstructive pulmonary disease who is being admitted for possible CHF exacerbation. The patient has been having more trouble breathing for the last 2 to 3 days, ran out of his medication and [facility] but could not see the doctor. His breathing kept getting worse and he decided to come to the Emergency Department. While in the ED, he was found to have troponin of 0.06. His BNP was also significantly elevated with a value more than 18,000 and his chest x-ray showed possible CHF. He is being admitted for further evaluation and management. He was saturating 95 to 97% on room air. He denies any fever, chest pain. He kept talking tangential and not making clear sense on a lot of his conversation except some trouble breathing.   PAST MEDICAL HISTORY:  1. Chronic obstructive pulmonary disease.  2. Gastroesophageal reflux disease.  3. History of chronic atrial fibrillation.   4. History of congestive heart failure with ejection fraction of 20% with global hypokinesis.  5. Hypertension.  6. CKD, stage III with a baseline creatinine of 1.7.   PAST SURGICAL HISTORY: Hiatal hernia.   ALLERGIES: No known drug allergies.   MEDICATIONS AT HOME:  1. Advair 250/50 one puff b.i.d.  2. Albuterol 1 to 2 puffs inhaled every 4 to 6 hours as needed.  3. Aspirin 81 mg p.o. daily.  4. Coreg 3.125 mg p.o. t.i.d.  5. Lasix 40 mg p.o. daily.  6. K-Lor 10 mEq p.o. daily.  7. Lipitor 20 mg p.o. at bedtime. 8. Omeprazole 20 mg p.o. b.i.d.  9. ProAir 2 puffs inhaled  4 times a day. 10. Quinapril 40 mg p.o. b.i.d.  11. Tylenol 325 mg 1 to 2 tablets p.o. every 4 to 6 hours as needed.   SOCIAL HISTORY: Quit smoking about five years ago. Occasional alcohol use. No IV drugs of abuse.   FAMILY HISTORY: Positive for dialysis and hypertension.   REVIEW OF SYSTEMS: CONSTITUTIONAL: No fever. Positive fatigue and weakness. EYES: No blurred or double vision. ENT: No tinnitus or ear pain. RESPIRATORY: Positive for cough, dry in nature. Some shortness of breath. No fever. CARDIOVASCULAR: Shortness of breath present mainly on exertion. No orthopnea or PND. GI: No nausea, vomiting, diarrhea. GU: No dysuria or hematuria. ENDOCRINE: No polyuria or nocturia. HEMATOLOGY: No anemia or easy bruising. SKIN: No rash or lesion. MUSCULOSKELETAL: No arthritis or muscle cramps. NEUROLOGIC: No tingling, numbness, or weakness. PSYCHIATRIC: No history of anxiety or depression.   PHYSICAL EXAMINATION:   VITAL SIGNS: Temperature 97.7, heart rate 62 per minute, respirations 16 per minute, blood pressure 121/90 mmHg. He is saturating 100% on 2 liters oxygen via nasal cannula and he was saturating 95% on 2 liters oxygen via nasal cannula.   GENERAL: The patient is a 54 year old male lying in the bed comfortably without any acute distress.   EYES: Pupils equal, round, and reactive to light and accommodation. No scleral icterus. Extraocular muscles intact.   HEENT: Head atraumatic, normocephalic. Oropharynx and nasopharynx clear.   NECK: Supple. No jugular venous distention. No  thyroid enlargement or tenderness.   LUNGS: Decreased breath sounds at the bases. No wheezing, rales, rhonchi, or crepitation.   CARDIOVASCULAR: S1, S2 normal. 2/6 systolic ejection murmur heard best at the right sternal border. No rubs or gallop.   ABDOMEN: Soft, nontender, nondistended. Bowel sounds present. No organomegaly or mass.   EXTREMITIES: No pedal edema, cyanosis, or clubbing.   NEUROLOGIC: Nonfocal  examination. Cranial nerves III to XII intact. Muscle strength 5/5. Extremity sensation intact.   PSYCHIATRIC: The patient is oriented to time, place, and person x3. He is talking at times very tangentially and mentioning about his shortness of breath and sometimes not making sense of what he's talking.   LABORATORY, DIAGNOSTIC, AND RADIOLOGICAL DATA: Sodium 146, potassium 4.3, chloride 108, CO2 25, BUN 22, creatinine 1.94. BNP [id number]. Normal liver function tests. First set of cardiac enzymes showed CK of 208, MB fraction 7l.0, troponin 0.06. Normal TSH. Normal CBC.   Chest x-ray while in the ED showed pulmonary vascular congestion and possible bronchitis versus mild edema. EKG shows atrial fibrillation, no major ST-T changes, prolonged QT of 420 ms. There is some T wave depression in lead V5 and V6 mainly in the lateral leads, which he did have on old EKG in December 2012.   IMPRESSION AND PLAN:  1. Congestive heart failure exacerbation, likely acute on a chronic, systolic in nature, ejection fraction of 20% on the old previous echo of September 2012 with mitral regurgitation. Will consult Cardiology. Obtain daily weights, strict I's and O's. May need to get an AICD. Will leave it up to Cardiology to decide. Will continue IV Lasix twice a day for now. Cannot give ACE inhibitor due to acute renal failure. 2. Acute bronchitis with cough and shortness of breath. Will start him on Levaquin orally. Send sputum culture. 3. Acute on chronic kidney disease stage II to III with a baseline creatinine of 1.7, close to baseline. Will hold ACE inhibitor for now.  4. Elevated troponin likely due to supply/demand ischemia. Cannot rule out MI at this time. Will monitor him on telemetry. Start him on aspirin, beta-blocker, and nitroglycerin. Hold off full dose anticoagulation. Await Cardiology input.  5. Hypernatremia. Will monitor while on Lasix. This should get corrected. Monitor closely.  6. Chronic obstructive  pulmonary disease. Will continue Advair. Start him on DuoNeb breathing treatment.  7. Hyperlipidemia. Will continue Lipitor.  8. History of gastroesophageal reflux disease. Will continue omeprazole.   CODE STATUS: FULL CODE.   TOTAL TIME TAKING CARE OF THIS PATIENT: 55 minutes.   ____________________________ Ellamae Sia. Sherryll Burger, MD vss:drc D: 04/07/2011 20:18:17 ET T: 04/08/2011 07:11:02 ET JOB#: [id number]  cc: Holden Draughon S. Sherryll Burger, MD, <Dictator> [facility] Ellamae Sia [facility] MD ELECTRONICALLY SIGNED 04/08/2011 21:59

## 2014-07-17 IMAGING — US US RENAL KIDNEY
1 series · 14 of 25 positions shown · non-contrast
Comparison: none

REASON FOR EXAM: acute renal failure, CKD stage III
COMMENTS:

[Series 1: us renal kidney · 0.28mm/px · 14 of 50 slices shown]
[im 1/50]
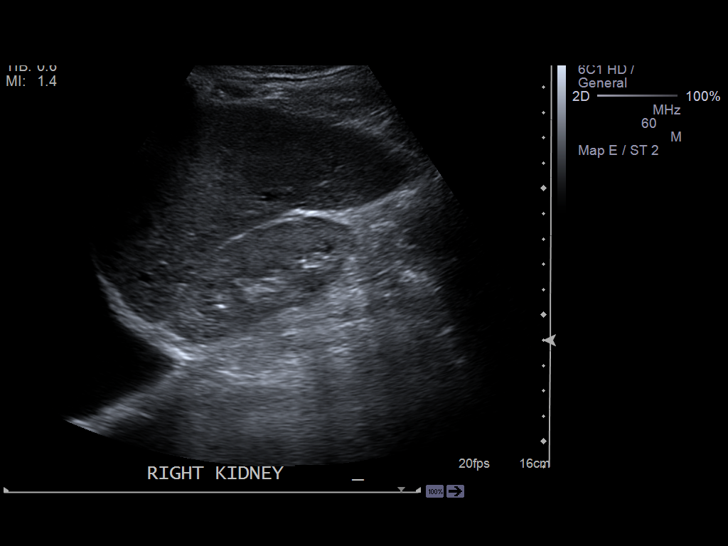
[im 5/50]
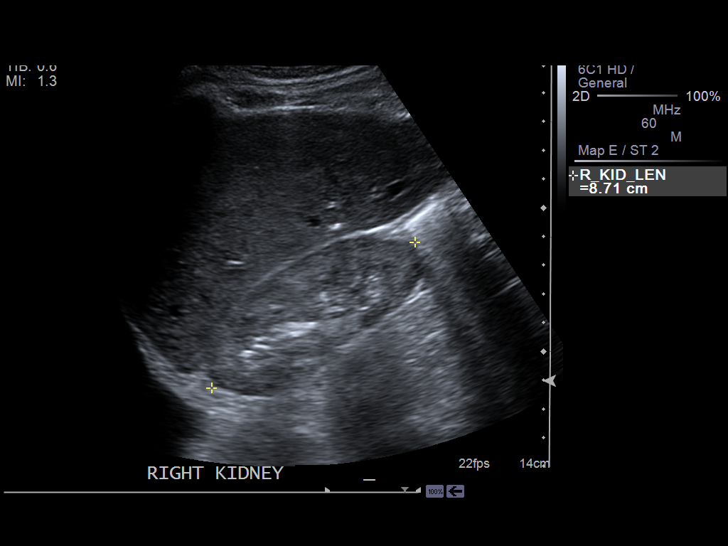
[im 9/50]
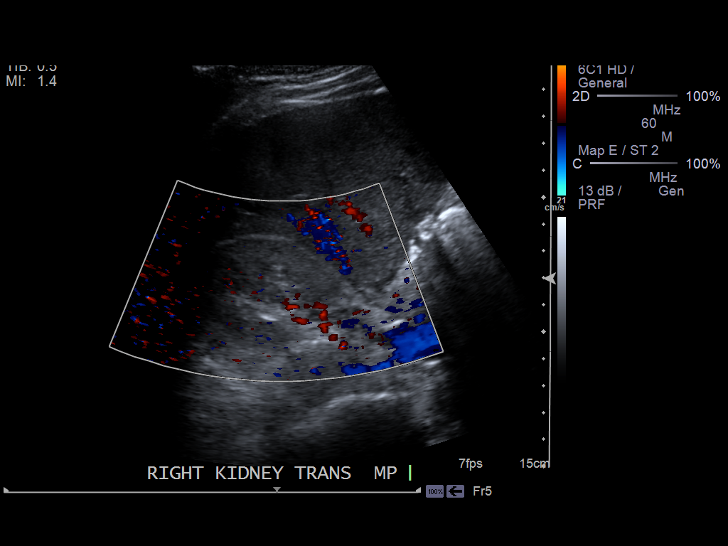
[im 13/50]
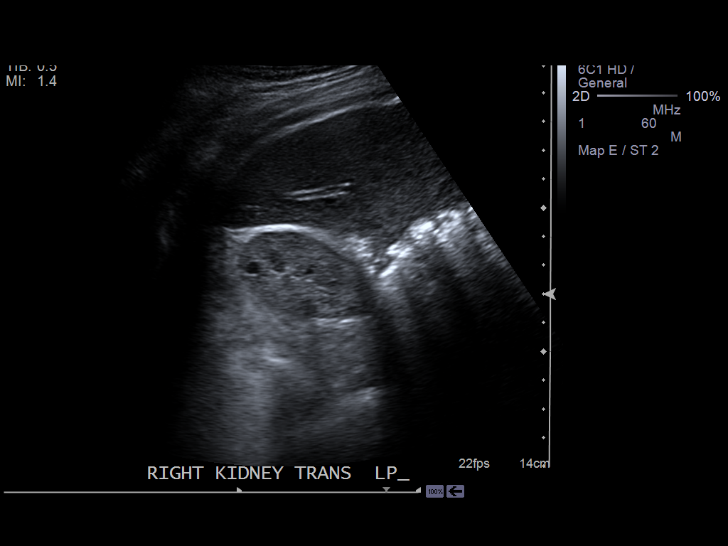
[im 17/50]
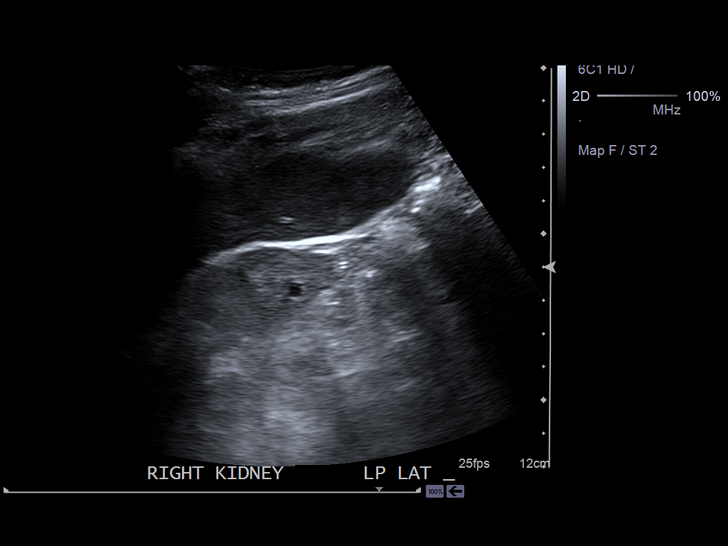
[im 19/50]
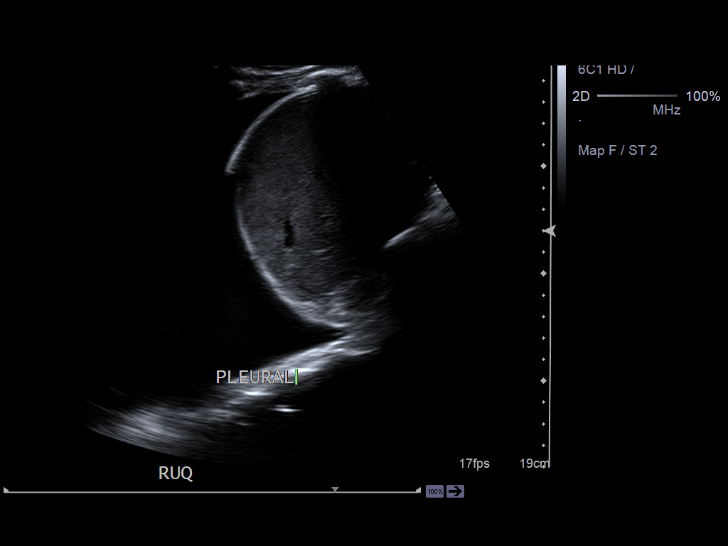
[im 23/50]
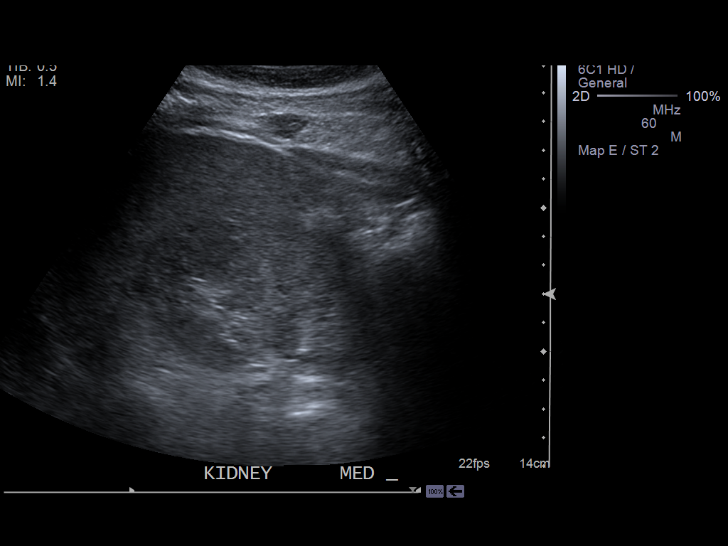
[im 27/50]
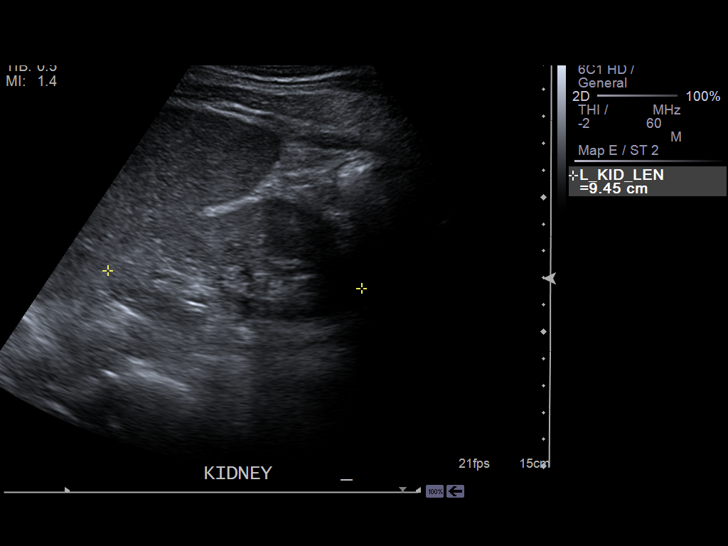
[im 31/50]
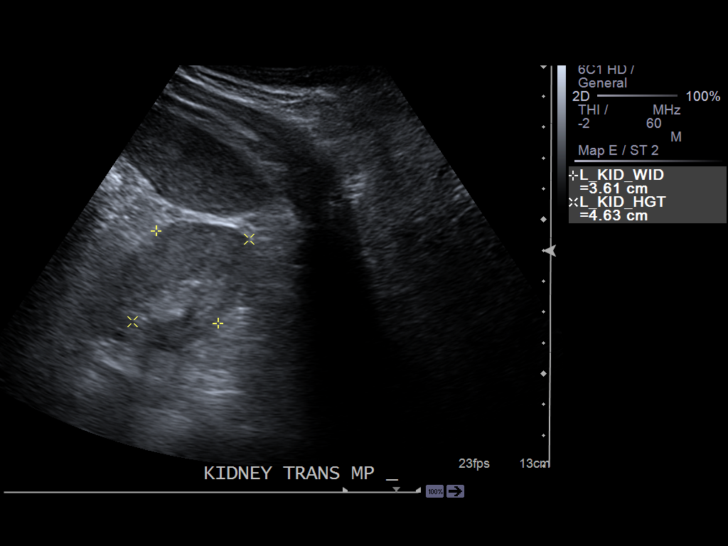
[im 33/50]
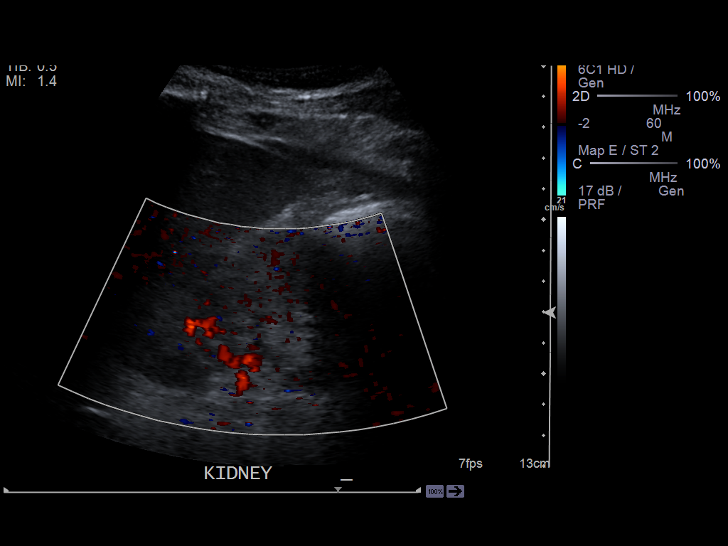
[im 37/50]
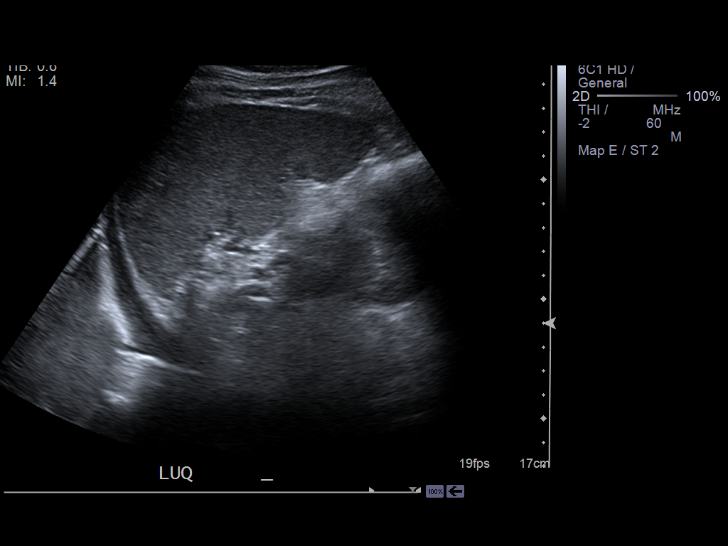
[im 41/50]
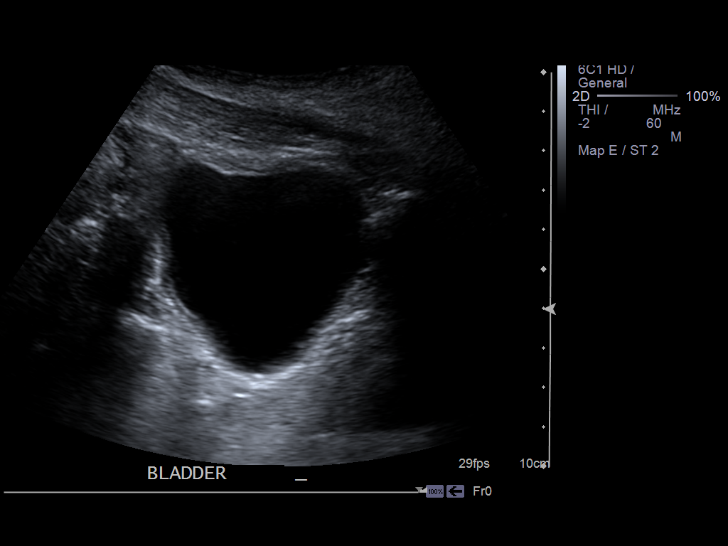
[im 45/50]
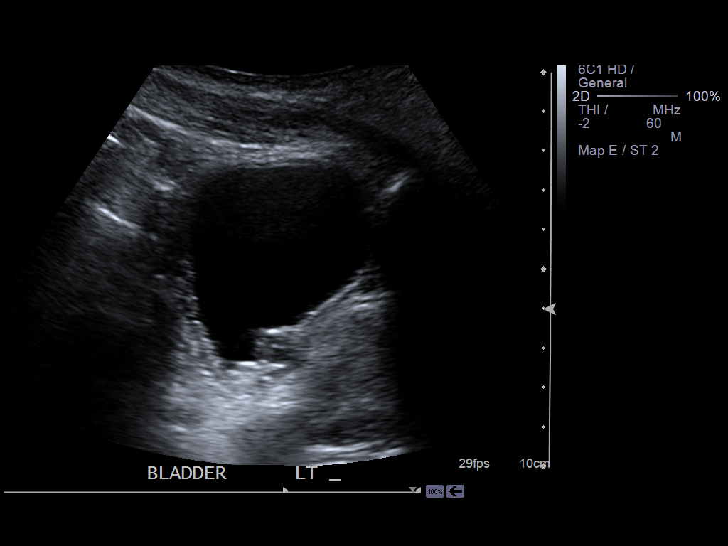
[im 50/50]
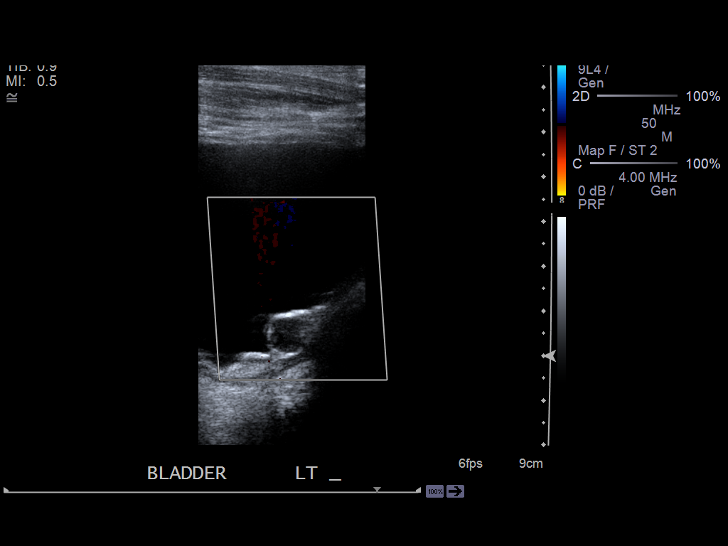

[14 of 25 positions shown; findings below may reference images not displayed]

PROCEDURE:     US  - US KIDNEY  - September 11, 2011  [DATE]

RESULT:     Both kidneys demonstrate increased echotexture. On the right
this is greater than that of the adjacent liver and is consistent with
medical renal disease. Neither kidney exhibits evidence of obstruction. The
right kidney measures 8.7 x 5.1 x 4.4 cm. The left kidney measures 9.4 x
x 3.6 cm. In the lower pole of the right kidney there is an approximately 3
x 4 x 6 mm diameter cyst.

Within the urinary bladder ureteral jets are demonstrated bilaterally. There
is likely a ureterocele on the left. There are bilateral pleural effusions
with the largest being on the right.
IMPRESSION: 1. There is no evidence of obstruction of either kidney.
2. There is bilateral renal atrophy and the echotexture of the kidneys is
increased consistent with medical renal disease.
3. There may be a ureterocele on the left.
4. There are bilateral pleural effusions

[REDACTED]

## 2014-09-03 IMAGING — CR DG CHEST 2V
1 series · 4 of 4 positions shown · non-contrast
Comparison: none

REASON FOR EXAM: SOB
COMMENTS:

[Series 1: pa · 0.17mm/px · 4 of 4 slices shown]
[im 1/4]
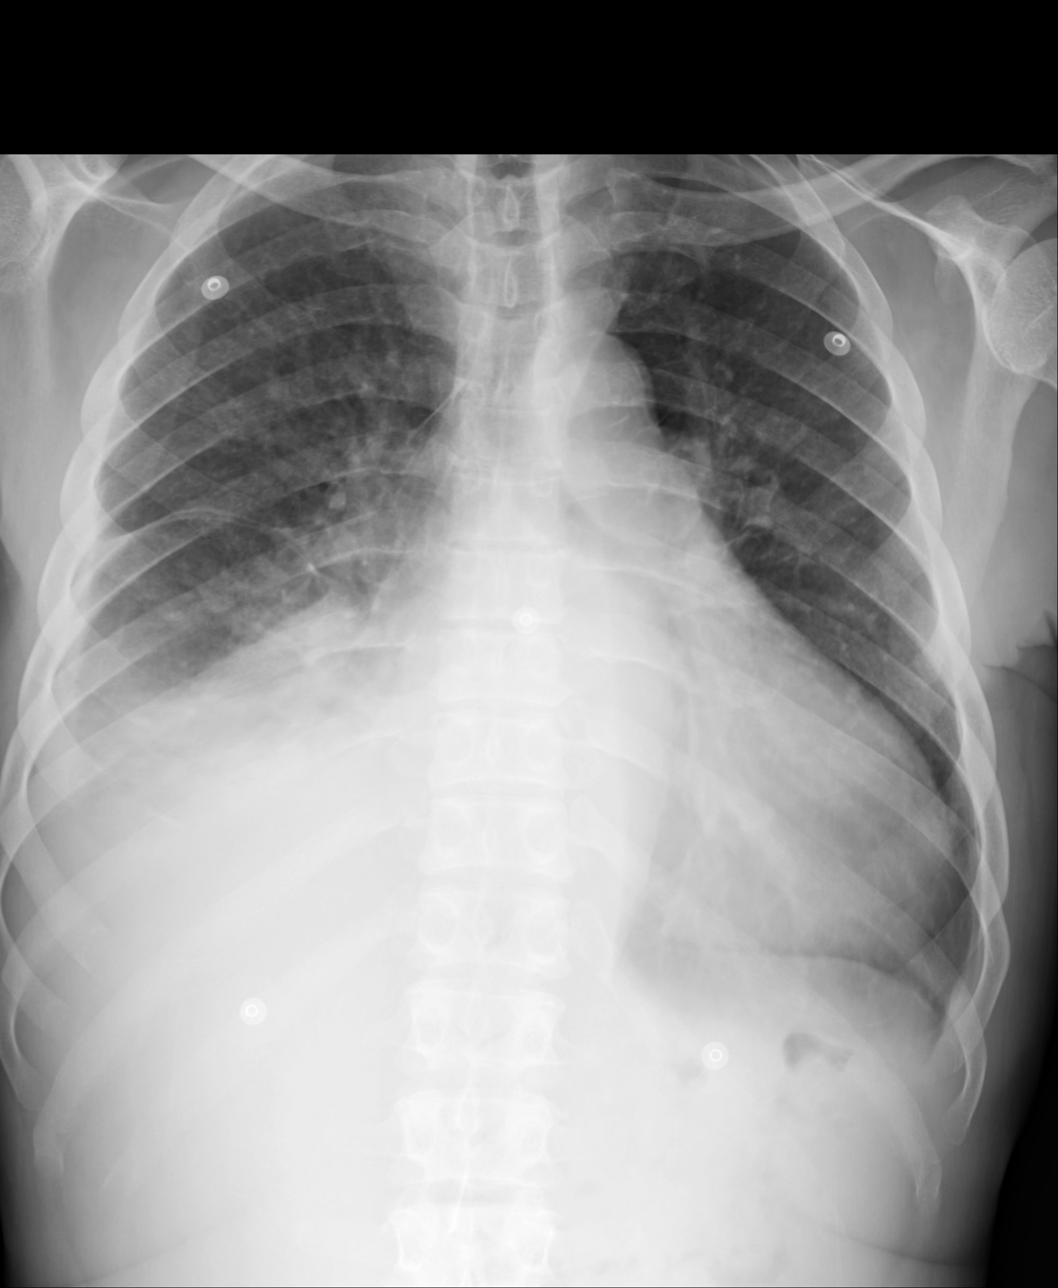
[im 2/4]
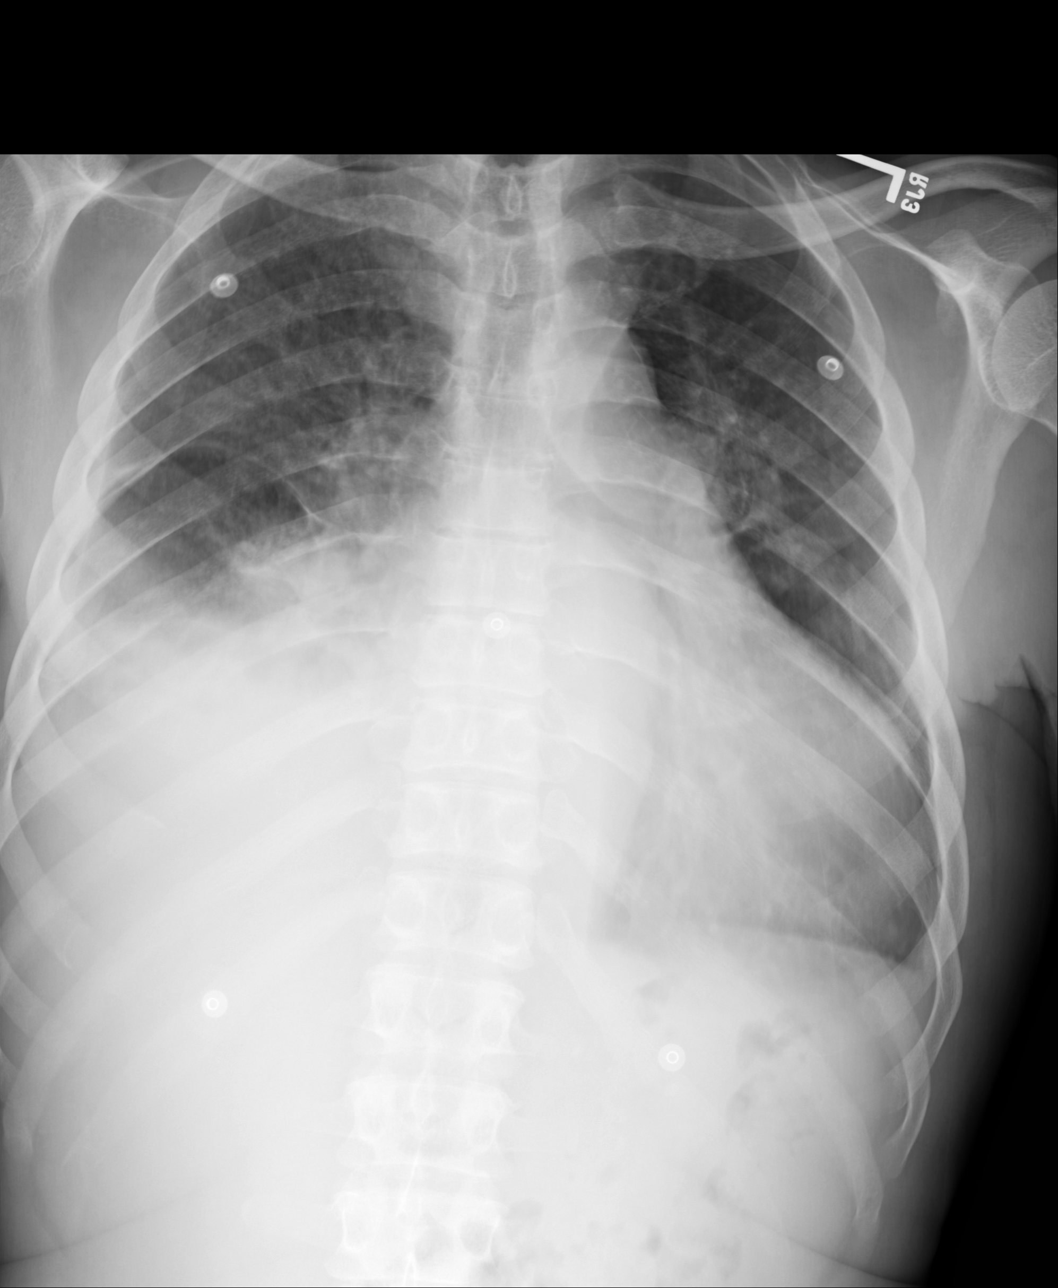
[im 3/4]
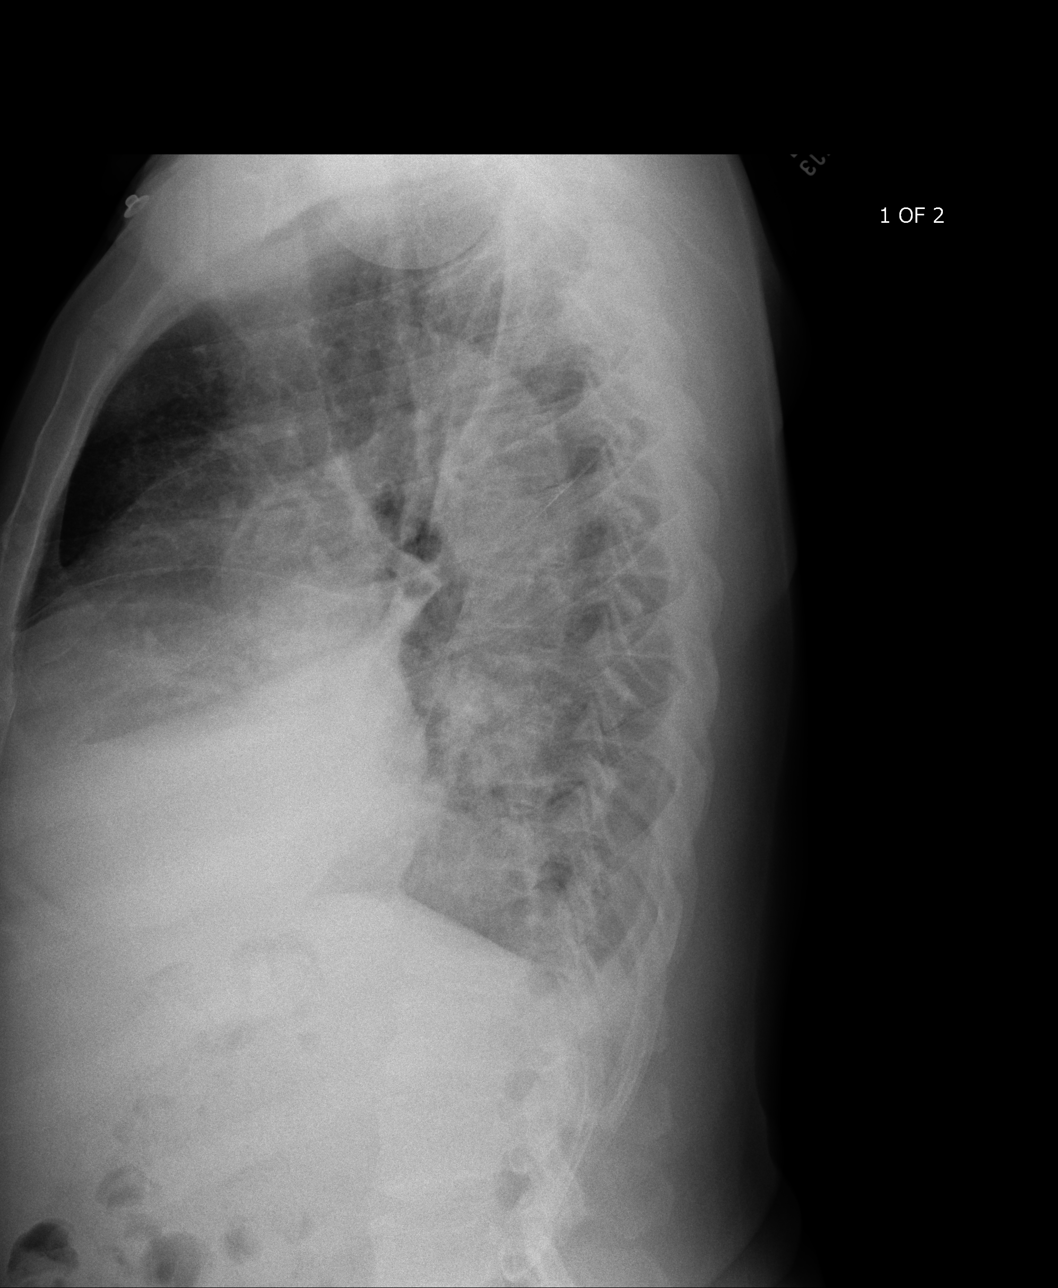
[im 4/4]
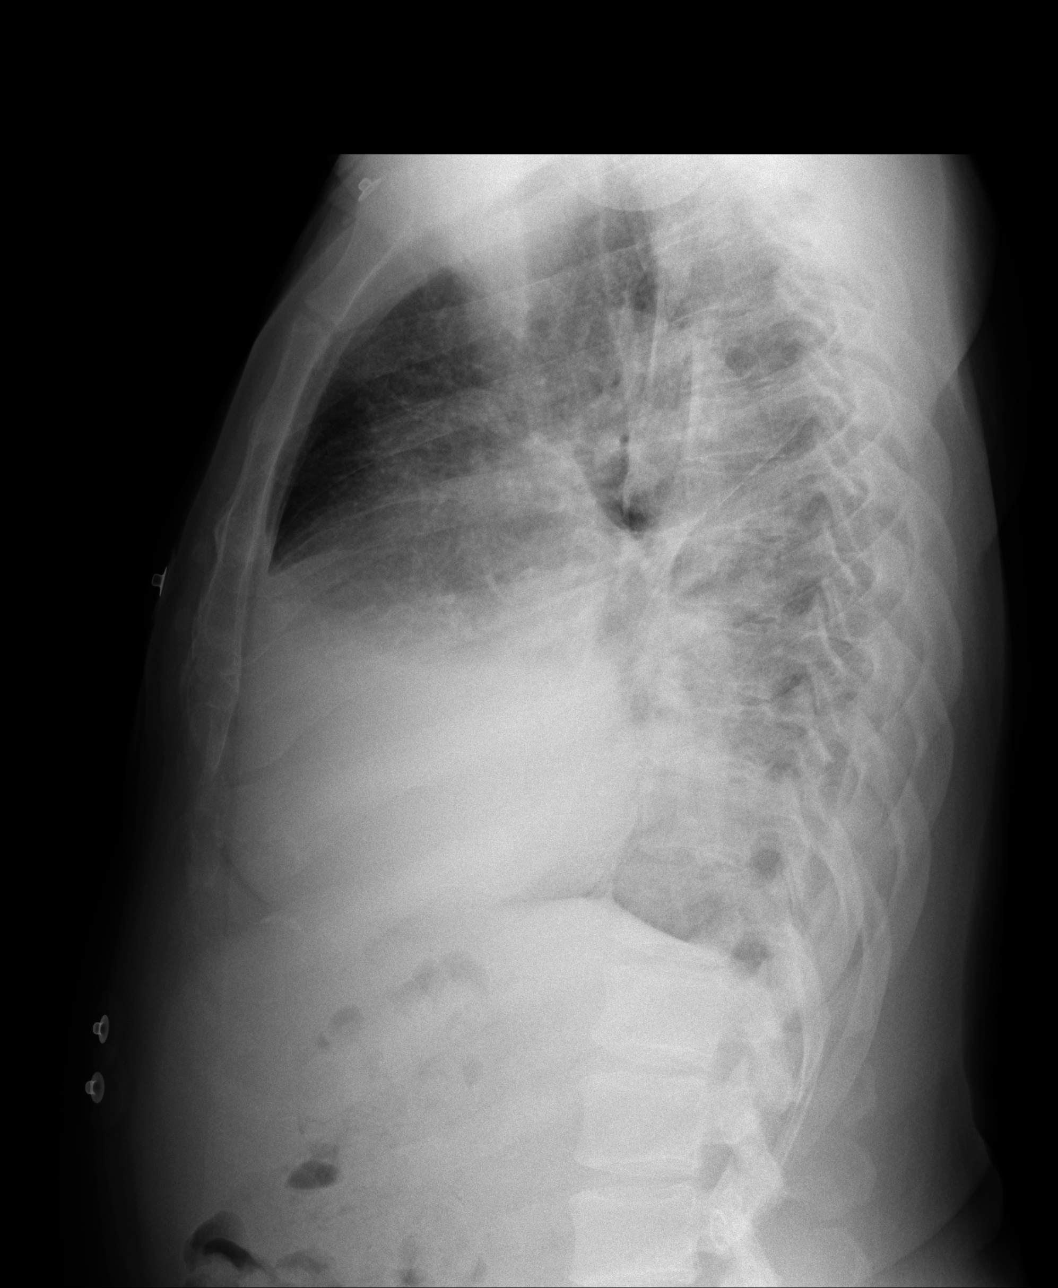

[4 of 4 positions shown; findings below may reference images not displayed]

PROCEDURE:     DXR - DXR CHEST PA (OR AP) AND LATERAL  - October 29, 2011 [DATE]

RESULT:     Comparison is made to an image 10 September, 2011.

The cardiac silhouette is enlarged. There is elevation of the right
hemidiaphragm versus right pleural effusion with right lung base infiltrate
that appears to be predominantly in the right lower lobe. Pulmonary vascular
congestion and possibly some mild interstitial edema is present. No
pneumothorax is evident.
IMPRESSION: 1. Cardiomegaly.
2. Small right pleural effusion.
3. Cannot exclude some right lung base infiltrate versus possibly
atelectasis with only vascular congestion and mild interstitial edema.
Correlate for congestive heart failure versus underlying acute infectious or
inflammatory process with clinical and laboratory data.

[REDACTED]

## 2015-11-20 IMAGING — CR DG CHEST 2V
1 series · 2 of 2 positions shown · non-contrast
Comparison: PA and lateral chest 12/09/2011. Single view of the
chest 01/01/2013 and 01/12/2013.

CLINICAL DATA: Shortness of breath.  Congestive heart failure.

EXAM:
CHEST  2 VIEW

[Series 1: w chest pa · 0.14mm/px · 2 of 2 slices shown]
[im 1/2]
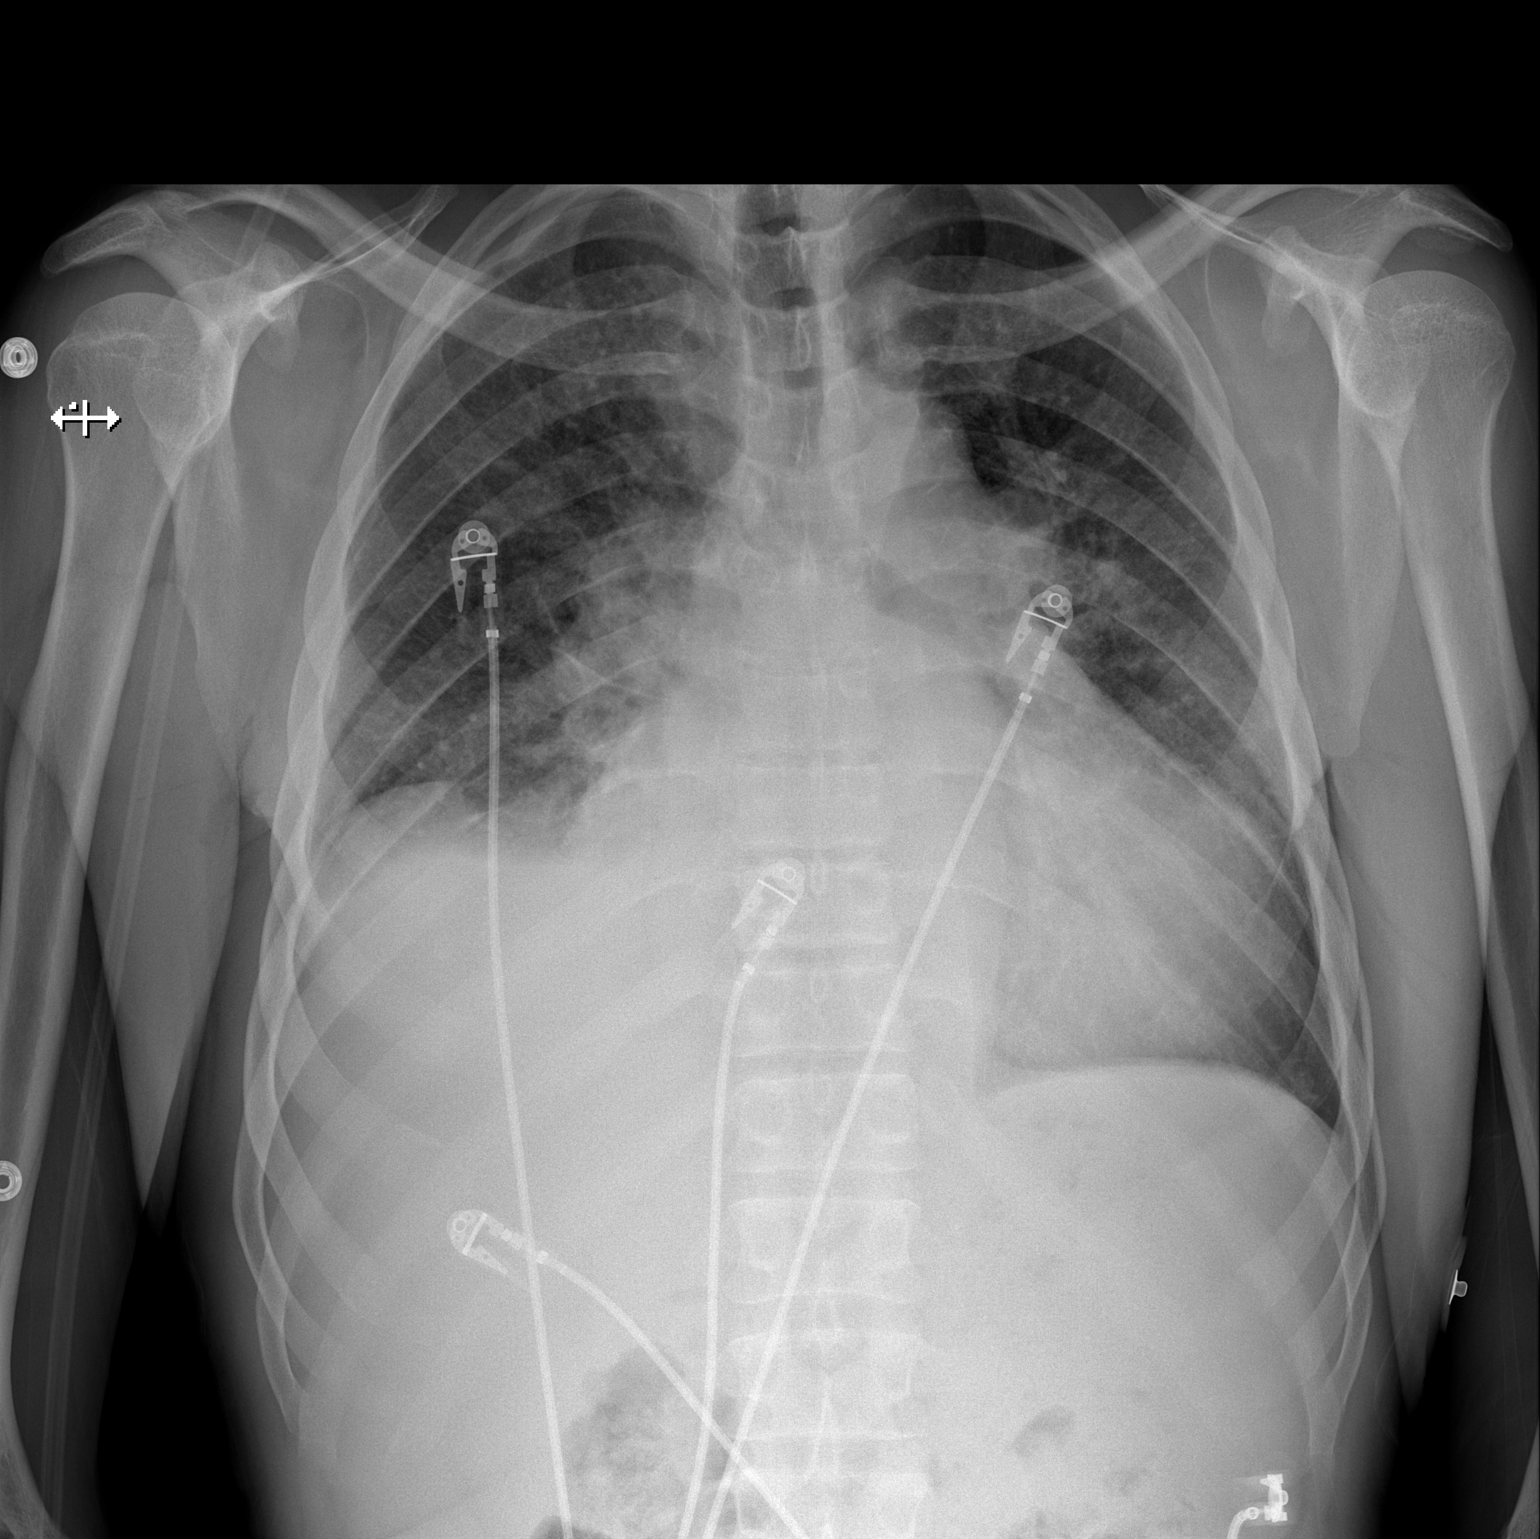
[im 2/2]
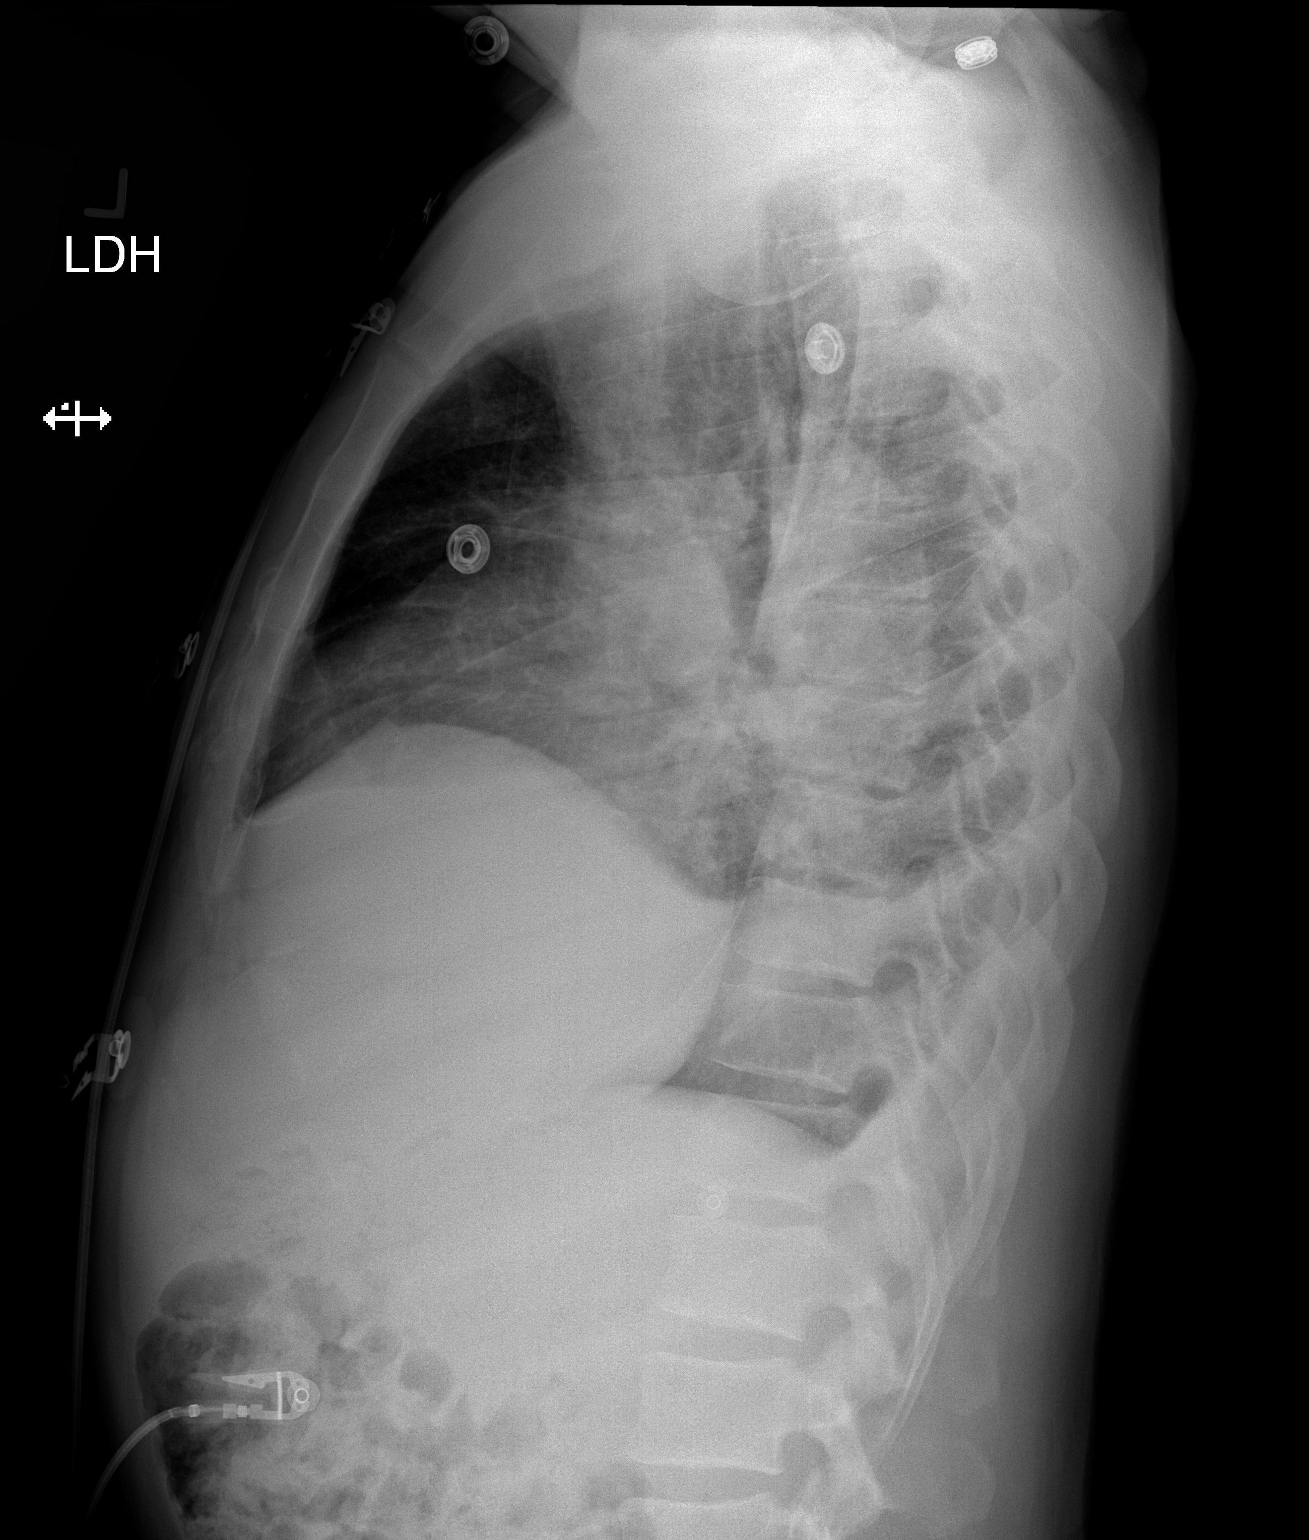

[2 of 2 positions shown; findings below may reference images not displayed]

FINDINGS: Again seen is marked cardiomegaly. Pulmonary edema seen on the most
recent study has improved. There are small to moderate pleural
effusions, greater on the right, with associated basilar
atelectasis. No pneumothorax.
IMPRESSION: Improved pulmonary edema since the most recent study.

Marked cardiomegaly.

Small to moderate bilateral pleural effusions and basilar
atelectasis, worse on the right.

## 2015-12-27 IMAGING — CR DG CHEST 2V
1 series · 2 of 2 positions shown · non-contrast
Comparison: 01/14/2013

CLINICAL DATA: Shortness of breath with wheezing and cough.

EXAM:
CHEST  2 VIEW

[Series 1: pa · 0.17mm/px · 2 of 2 slices shown]
[im 1/2]
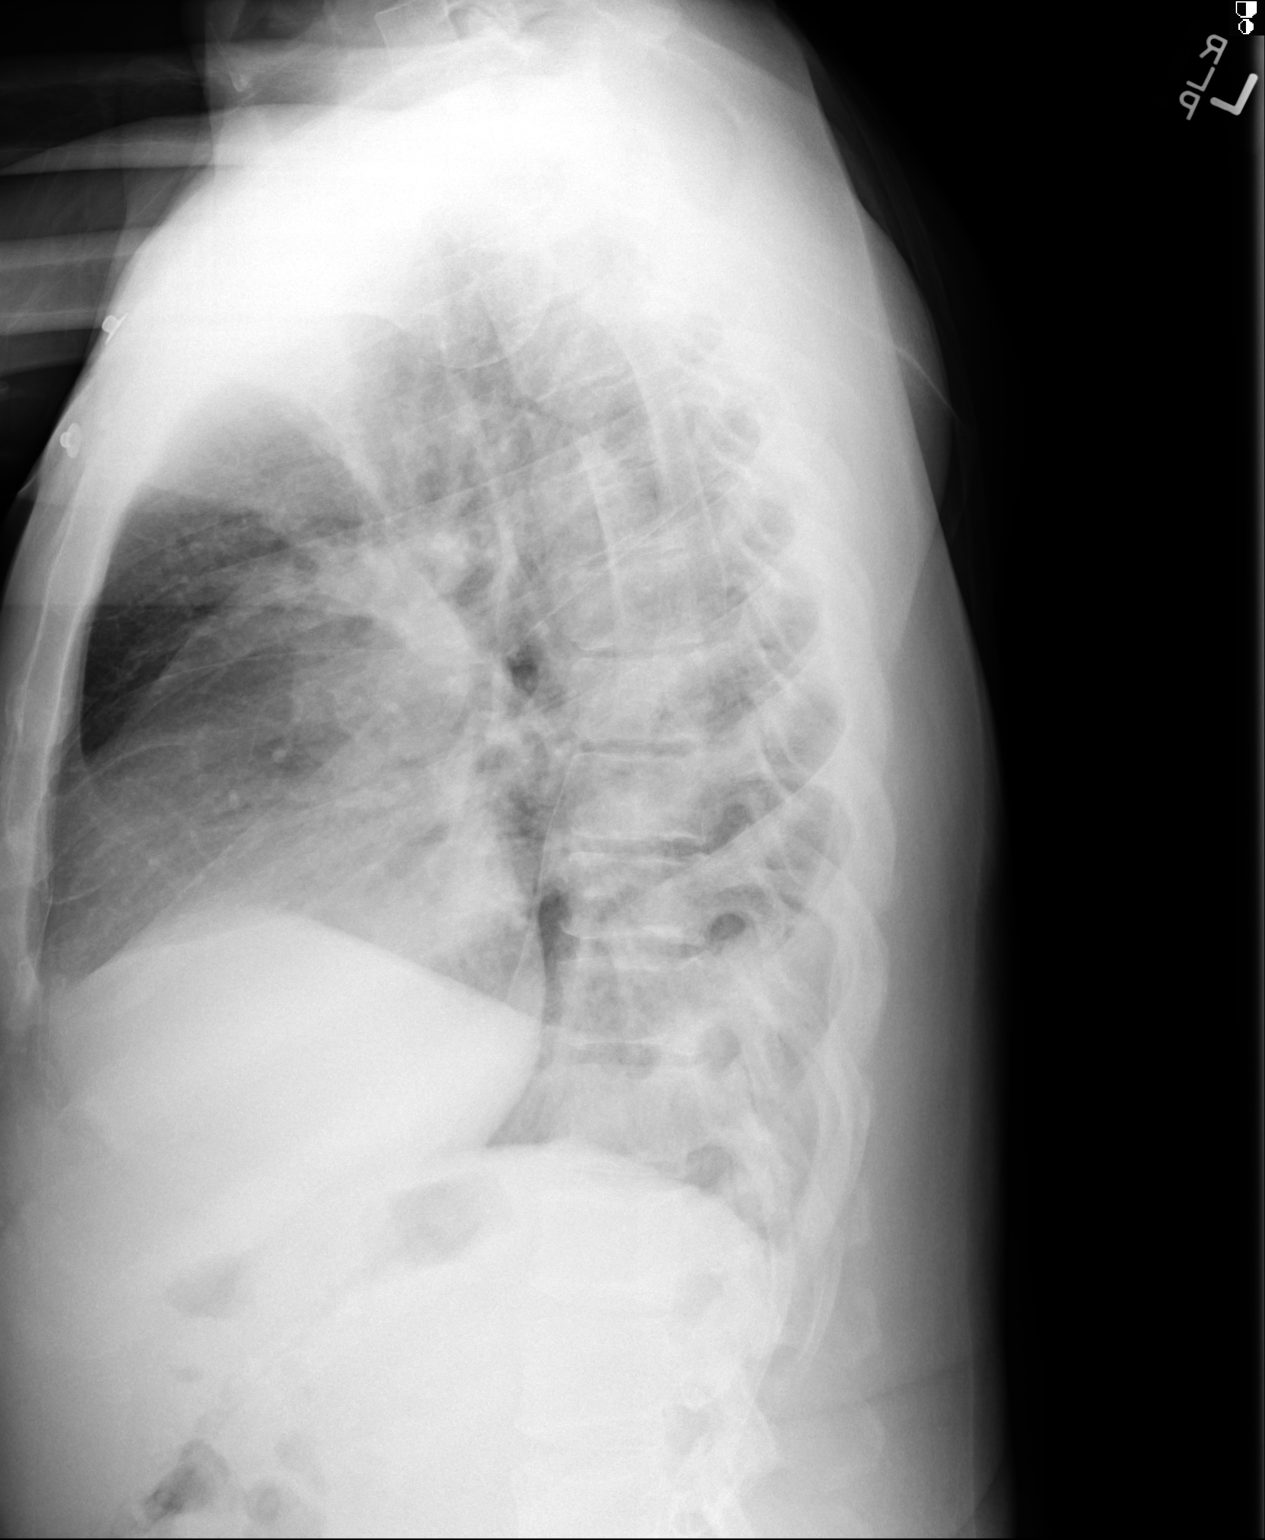
[im 2/2]
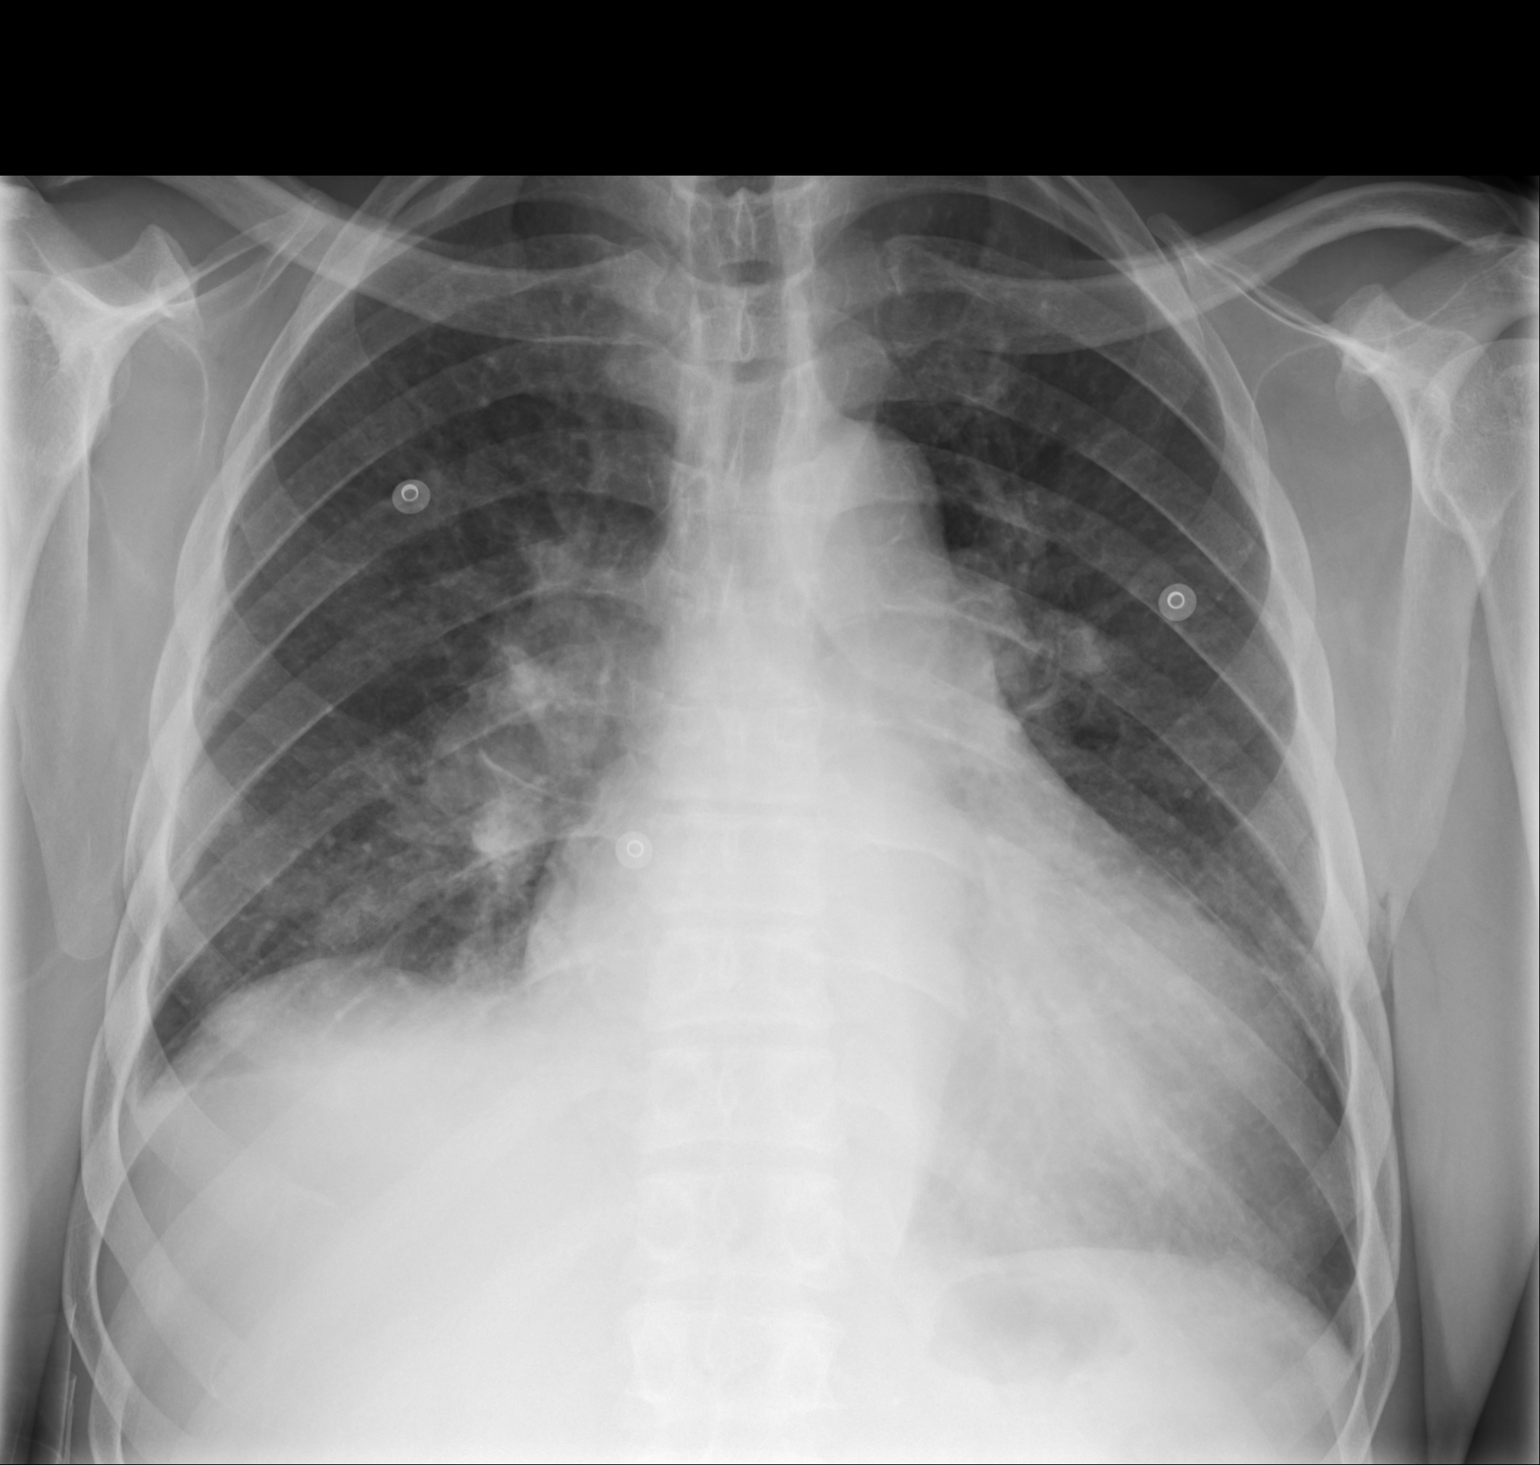

[2 of 2 positions shown; findings below may reference images not displayed]

FINDINGS: Stable asymmetric elevation of the right hemidiaphragm. Small right
pleural effusion again noted. Aeration in the bases is slightly
improved in the interval. Vascular congestion continues to improve
as well. Right hilar fullness is stable. The cardio pericardial
silhouette is enlarged.
IMPRESSION: Slight interval improvement in aeration.

Cardiomegaly with vascular congestion and stable right hilar
fullness.
